# Patient Record
Sex: Female | Born: 1980
Health system: Southern US, Community
[De-identification: ages and names within clinical notes are randomized; demographics above are authoritative.]

## PROBLEM LIST (undated history)

## (undated) DIAGNOSIS — T7840XA Allergy, unspecified, initial encounter: Secondary | ICD-10-CM

## (undated) DIAGNOSIS — Z8619 Personal history of other infectious and parasitic diseases: Secondary | ICD-10-CM

## (undated) HISTORY — DX: Allergy, unspecified, initial encounter: T78.40XA

## (undated) HISTORY — DX: Personal history of other infectious and parasitic diseases: Z86.19

---

## 2019-06-05 ENCOUNTER — Ambulatory Visit: Payer: 59 | Admitting: Physician Assistant

## 2019-06-05 ENCOUNTER — Other Ambulatory Visit: Payer: Self-pay

## 2019-06-05 ENCOUNTER — Encounter: Payer: Self-pay | Admitting: Physician Assistant

## 2019-06-05 VITALS — BP 120/84 | HR 89 | Temp 99.2°F | Resp 16 | Ht 64.75 in | Wt 144.0 lb

## 2019-06-05 DIAGNOSIS — F32A Depression, unspecified: Secondary | ICD-10-CM

## 2019-06-05 DIAGNOSIS — R5383 Other fatigue: Secondary | ICD-10-CM

## 2019-06-05 DIAGNOSIS — Z30011 Encounter for initial prescription of contraceptive pills: Secondary | ICD-10-CM | POA: Diagnosis not present

## 2019-06-05 DIAGNOSIS — F419 Anxiety disorder, unspecified: Secondary | ICD-10-CM

## 2019-06-05 DIAGNOSIS — F329 Major depressive disorder, single episode, unspecified: Secondary | ICD-10-CM

## 2019-06-05 DIAGNOSIS — J310 Chronic rhinitis: Secondary | ICD-10-CM | POA: Diagnosis not present

## 2019-06-05 MED ORDER — NORETHINDRONE ACET-ETHINYL EST 1-20 MG-MCG PO TABS: 1.0000 | ORAL_TABLET | Freq: Every day | ORAL | 11 refills | Status: DC

## 2019-06-05 MED ORDER — AZELASTINE-FLUTICASONE 137-50 MCG/ACT NA SUSP: 2.0000 | Freq: Every day | NASAL | 1 refills | Status: DC

## 2019-06-05 MED ORDER — BUPROPION HCL ER (XL) 150 MG PO TB24: 150.0000 mg | ORAL_TABLET | Freq: Every day | ORAL | 1 refills | Status: DC

## 2019-06-05 NOTE — Patient Instructions (Addendum)
Please go to the lab today for blood work.  I will call you with your results. We will alter treatment regimen(s) if indicated by your results.   Continue the Allegra and start the Dymista spray as directed. Monitor symptoms over the next couple of weeks. Please let me know if symptoms are not improving.   Please continue counseling.  Start the Wellbutrin once daily.  Follow-up with me in 3-4 weeks for reassessment.   I have switched you to Loestrin for birth control. This is more similar to your prior birth control but is a lower dose.  Let [place] see how this works for you.  It was very nice meeting you today. Welcome to [facility]!

## 2019-06-05 NOTE — Progress Notes (Signed)
Patient presents to clinic today to establish care.  Acute Concerns: Over the past 2 months having significant increase in stressors. Notes she and her fiance have moved in together and have started to blend their families. Notes her future step-children have been living with them every other week.This has been a major adjustment,m esp[ecially since they are pre-teen, teenage aged.. Not handling very well -- causing anxiety. Has started counseling to help with this adjustment. Sometimes with depressed mood. Denies SI/HI. Thinking about medication. Also noting change in focus and mental fogginess. This is mainly at work. Has  Notes fatigue Fine at work but when she gets home she crashes. Sleeps well overall. Previously on a regimen of Prozac when she was younger (teenage years). BuSpar a few years ago for anxiety (made her too sleepy). Denies SI/HI.  Chronic Issues: Chronic Rhinitis -- Is currently on Flonase and Allegra which she notes worked for her in [place] but here still having breakthrough symptoms. Denies chest tightness, wheezing or SOB.  Contraception Management -- Is currently on a regimen of Norethindrone. 0.35 mcg daily. Has been on this for > 6-7 months. Notes she is taking as directed. Does not like this medication and preferred her previous combination OCP. No history of clotting.[place] [place] LMP finished 2 weeks ago.   Health Maintenance: Immunizations -- UTD PAP --  Endorses last PCP smear within past couple of years. Will need records..   Past Medical History:  Diagnosis Date  . Allergy   . History of chickenpox     Past Surgical History:  Procedure Laterality Date  . CESAREAN SECTION  2017    Current Outpatient Medications on File Prior to Visit  Medication Sig Dispense Refill  . fexofenadine (ALLEGRA) 180 MG tablet Take 180 mg by mouth daily.    . fluticasone (FLONASE) 50 MCG/ACT nasal spray Place 2 sprays into both nostrils daily.     No current facility-administered  medications on file prior to visit.     No Known Allergies  Family History  Problem Relation Age of Onset  . Kidney failure Mother        secondary to flu-like illness and medication overuse. Not cause of death. Had to have renal transplant.   . Hypertension Mother        2/2 renal issues  . Stroke Father   . Healthy Brother   . Healthy Daughter     Social History   Socioeconomic History  . Marital status: Single    Spouse name: Not on file  . Number of children: 1  . Years of education: Not on file  . Highest education level: Not on file  Occupational History  . Not on file  Social Needs  . Financial resource strain: Not on file  . Food insecurity    Worry: Not on file    Inability: Not on file  . Transportation needs    Medical: Not on file    Non-medical: Not on file  Tobacco Use  . Smoking status: Former Research scientist (life sciences)  . Smokeless tobacco: Never Used  Substance and Sexual Activity  . Alcohol use: Yes  . Drug use: Never  . Sexual activity: Yes  Lifestyle  . Physical activity    Days per week: Not on file    Minutes per session: Not on file  . Stress: Not on file  Relationships  . Social Herbalist on phone: Not on file    Gets together: Not on file  Attends religious service: Not on file    Active member of club or organization: Not on file    Attends meetings of clubs or organizations: Not on file    Relationship status: Not on file  . Intimate partner violence    Fear of current or ex partner: Not on file    Emotionally abused: Not on file    Physically abused: Not on file    Forced sexual activity: Not on file  Other Topics Concern  . Not on file  Social History Narrative  . Not on file   Review of Systems  Constitutional: Positive for malaise/fatigue. Negative for chills, fever and weight loss.  Eyes: Negative for blurred vision and double vision.  Respiratory: Negative for cough and shortness of breath.   Cardiovascular: Negative for  chest pain and palpitations.  Gastrointestinal: Negative for abdominal pain, constipation, diarrhea, nausea and vomiting.  Genitourinary: Negative for dysuria, frequency, hematuria and urgency.  Musculoskeletal: Negative for joint pain and myalgias.  Neurological: Negative for dizziness and loss of consciousness.  Endo/Heme/Allergies: Positive for environmental allergies.  Psychiatric/Behavioral: Positive for depression. Negative for hallucinations, substance abuse and suicidal ideas. The patient is nervous/anxious.    BP 120/84   Pulse 89   Temp 99.2 F (37.3 C) (Skin)   Resp 16   Ht 5' 4.75" (1.645 m)   Wt 144 lb (65.3 kg)   SpO2 99%   BMI 24.15 kg/m   Physical Exam Vitals signs reviewed.  Constitutional:      Appearance: Normal appearance.  HENT:     Head: Normocephalic and atraumatic.     Right Ear: Tympanic membrane normal.     Left Ear: Tympanic membrane normal.     Nose: Nose normal.     Mouth/Throat:     Mouth: Mucous membranes are moist.  Eyes:     Conjunctiva/sclera: Conjunctivae normal.     Pupils: Pupils are equal, round, and reactive to light.  Neck:     Musculoskeletal: Neck supple. No muscular tenderness.     Thyroid: No thyromegaly.  Cardiovascular:     Rate and Rhythm: Normal rate and regular rhythm.     Pulses: Normal pulses.     Heart sounds: Normal heart sounds.  Pulmonary:     Effort: Pulmonary effort is normal.     Breath sounds: Normal breath sounds.  Abdominal:     General: Bowel sounds are normal. There is no distension.     Palpations: Abdomen is soft.     Tenderness: There is no abdominal tenderness.  Lymphadenopathy:     Cervical: No cervical adenopathy.  Neurological:     General: No focal deficit present.     Mental Status: She is alert and oriented to person, place, and time.  Psychiatric:        Mood and Affect: Mood normal.    Assessment/Plan: 1. Anxiety and depression Giving some focus issue along with mood changes, will  attempt trial of wellbutrin 150 mg once daily. Continue counseling. Follow-up in 4-6 weeks for reassessment. - buPROPion (WELLBUTRIN XL) 150 MG 24 hr tablet; Take 1 tablet (150 mg total) by mouth daily.  Dispense: 30 tablet; Refill: 1  2. Fatigue, unspecified type Likely related to her mood which we are treating. Exam unremarkable today. Will check labs to further assess as well. - Comp Met (CMET) - TSH - CBC w/Diff - Vitamin D (25 hydroxy)  3. Encounter for initial prescription of contraceptive pills Currently on OCP. LMP finished 2  weeks ago. Will switch her back to a combination OCP but at lower dose due to age. Follow-up discussed.  4. Chronic rhinitis Will have her continue Allegra. Switch Flonase to Dymista. If not improving may need to consider trial or singulair or allergist assessment.  - Azelastine-Fluticasone 137-50 MCG/ACT SUSP; Place 2 sprays into the nose daily.  Dispense: 23 g; Refill: Potter, PA-C

## 2019-06-06 LAB — CBC WITH DIFFERENTIAL/PLATELET
Basophils Absolute: 0 10*3/uL (ref 0.0–0.1)
Basophils Relative: 0.4 % (ref 0.0–3.0)
Eosinophils Absolute: 0 10*3/uL (ref 0.0–0.7)
Eosinophils Relative: 0.3 % (ref 0.0–5.0)
HCT: 44.6 % (ref 36.0–46.0)
Hemoglobin: 15 g/dL (ref 12.0–15.0)
Lymphocytes Relative: 20.2 % (ref 12.0–46.0)
Lymphs Abs: 1.7 10*3/uL (ref 0.7–4.0)
MCHC: 33.7 g/dL (ref 30.0–36.0)
MCV: 91.3 fl (ref 78.0–100.0)
Monocytes Absolute: 0.3 10*3/uL (ref 0.1–1.0)
Monocytes Relative: 4.2 % (ref 3.0–12.0)
Neutro Abs: 6.2 10*3/uL (ref 1.4–7.7)
Neutrophils Relative %: 74.9 % (ref 43.0–77.0)
Platelets: 299 10*3/uL (ref 150.0–400.0)
RBC: 4.88 Mil/uL (ref 3.87–5.11)
RDW: 12.7 % (ref 11.5–15.5)
WBC: 8.3 10*3/uL (ref 4.0–10.5)

## 2019-06-06 LAB — VITAMIN D 25 HYDROXY (VIT D DEFICIENCY, FRACTURES): VITD: 49.35 ng/mL (ref 30.00–100.00)

## 2019-06-06 LAB — COMPREHENSIVE METABOLIC PANEL
ALT: 33 U/L (ref 0–35)
AST: 25 U/L (ref 0–37)
Albumin: 4.8 g/dL (ref 3.5–5.2)
Alkaline Phosphatase: 34 U/L — ABNORMAL LOW (ref 39–117)
BUN: 10 mg/dL (ref 6–23)
CO2: 25 mEq/L (ref 19–32)
Calcium: 9.5 mg/dL (ref 8.4–10.5)
Chloride: 102 mEq/L (ref 96–112)
Creatinine, Ser: 0.54 mg/dL (ref 0.40–1.20)
GFR: 125.48 mL/min (ref >60.00)
Glucose, Bld: 89 mg/dL (ref 70–99)
Potassium: 3.8 mEq/L (ref 3.5–5.1)
Sodium: 138 mEq/L (ref 135–145)
Total Bilirubin: 0.6 mg/dL (ref 0.2–1.2)
Total Protein: 7.1 g/dL (ref 6.0–8.3)

## 2019-06-06 LAB — TSH: TSH: 1.03 u[IU]/mL (ref 0.35–4.50)

## 2019-06-08 ENCOUNTER — Telehealth: Payer: Self-pay | Admitting: Emergency Medicine

## 2019-06-08 DIAGNOSIS — J310 Chronic rhinitis: Secondary | ICD-10-CM

## 2019-06-08 MED ORDER — AZELASTINE HCL 0.1 % NA SOLN: 1.0000 | Freq: Two times a day (BID) | NASAL | 6 refills | Status: DC

## 2019-06-08 NOTE — Telephone Encounter (Signed)
Rx sent to the pharmacy. D/c the Dymista nasal spray.

## 2019-06-08 NOTE — Telephone Encounter (Signed)
Spoke with patient and she advised that the Dymista nasal spray was too expensive. She has continued to use the [facility] for now. Please advise of cheaper medication.

## 2019-06-08 NOTE — Addendum Note (Signed)
Addended by: Leonidas Romberg on: 06/08/2019 11:20 AM   Modules accepted: Orders

## 2019-06-08 NOTE — Telephone Encounter (Signed)
Let's try to send in the Astelin nasal spray by itself -- 1 spray each nostril BID. She is to use this with the Flonase spray.

## 2019-06-21 ENCOUNTER — Telehealth: Payer: 59 | Admitting: Family

## 2019-06-21 DIAGNOSIS — R399 Unspecified symptoms and signs involving the genitourinary system: Secondary | ICD-10-CM | POA: Diagnosis not present

## 2019-06-21 MED ORDER — CEPHALEXIN 500 MG PO CAPS: 500.0000 mg | ORAL_CAPSULE | Freq: Two times a day (BID) | ORAL | 0 refills | Status: DC

## 2019-06-21 NOTE — Progress Notes (Signed)

## 2019-06-29 ENCOUNTER — Other Ambulatory Visit: Payer: Self-pay

## 2019-06-29 ENCOUNTER — Encounter: Payer: Self-pay | Admitting: Physician Assistant

## 2019-06-29 ENCOUNTER — Ambulatory Visit (INDEPENDENT_AMBULATORY_CARE_PROVIDER_SITE_OTHER): Payer: 59 | Admitting: Physician Assistant

## 2019-06-29 VITALS — BP 122/80 | HR 95 | Temp 98.6°F | Resp 14 | Ht 64.75 in | Wt 145.0 lb

## 2019-06-29 DIAGNOSIS — R252 Cramp and spasm: Secondary | ICD-10-CM

## 2019-06-29 DIAGNOSIS — F32A Depression, unspecified: Secondary | ICD-10-CM

## 2019-06-29 DIAGNOSIS — F329 Major depressive disorder, single episode, unspecified: Secondary | ICD-10-CM | POA: Diagnosis not present

## 2019-06-29 DIAGNOSIS — F419 Anxiety disorder, unspecified: Secondary | ICD-10-CM | POA: Diagnosis not present

## 2019-06-29 LAB — MAGNESIUM: Magnesium: 2.1 mg/dL (ref 1.5–2.5)

## 2019-06-29 MED ORDER — BUPROPION HCL ER (XL) 300 MG PO TB24: 300.0000 mg | ORAL_TABLET | Freq: Every day | ORAL | 2 refills | Status: DC

## 2019-06-29 NOTE — Progress Notes (Signed)
Patient presents to clinic today for follow-up of focus, anxiety and depression. Was started on a regimen of Wellbutrin XL 150 mg daily at last visit. Endorses taking as directed and tolerating well but has not noted significant change in symptoms. Notes she is getting more adjusted to her new schedule which does help. Would like to discuss medication dose.  Patient also notes infrequent but bothersome cramping of the hands. Can happen anytime. No symptoms happening while sleep at night. Keeps hydrated and tries to keep well-balanced diet. Denies pain or swelling of joints. Denies decreased ROM, numbness or tingling. Denies cramping of legs or feet.   Past Medical History:  Diagnosis Date  . Allergy   . History of chickenpox     Current Outpatient Medications on File Prior to Visit  Medication Sig Dispense Refill  . azelastine (ASTELIN) 0.1 % nasal spray Place 1 spray into both nostrils 2 (two) times daily. Along with Flonase nasal spray 30 mL 6  . buPROPion (WELLBUTRIN XL) 150 MG 24 hr tablet Take 1 tablet (150 mg total) by mouth daily. 30 tablet 1  . fexofenadine (ALLEGRA) 180 MG tablet Take 180 mg by mouth daily.    . fluticasone (FLONASE) 50 MCG/ACT nasal spray Place 2 sprays into both nostrils daily.    . norethindrone-ethinyl estradiol (LOESTRIN 1/20, 21,) 1-20 MG-MCG tablet Take 1 tablet by mouth daily. 1 Package 11   No current facility-administered medications on file prior to visit.     No Known Allergies  Family History  Problem Relation Age of Onset  . Kidney failure Mother        secondary to flu-like illness and medication overuse. Not cause of death. Had to have renal transplant.   . Hypertension Mother        2/2 renal issues  . Stroke Father   . Healthy Brother   . Healthy Daughter     Social History   Socioeconomic History  . Marital status: Single    Spouse name: Not on file  . Number of children: 1  . Years of education: Not on file  . Highest  education level: Not on file  Occupational History  . Not on file  Social Needs  . Financial resource strain: Not on file  . Food insecurity    Worry: Not on file    Inability: Not on file  . Transportation needs    Medical: Not on file    Non-medical: Not on file  Tobacco Use  . Smoking status: Former Research scientist (life sciences)  . Smokeless tobacco: Never Used  Substance and Sexual Activity  . Alcohol use: Yes  . Drug use: Never  . Sexual activity: Yes  Lifestyle  . Physical activity    Days per week: Not on file    Minutes per session: Not on file  . Stress: Not on file  Relationships  . Social Herbalist on phone: Not on file    Gets together: Not on file    Attends religious service: Not on file    Active member of club or organization: Not on file    Attends meetings of clubs or organizations: Not on file    Relationship status: Not on file  Other Topics Concern  . Not on file  Social History Narrative  . Not on file   Review of Systems - See HPI.  All other ROS are negative.  BP 122/80   Pulse 95   Temp 98.6 F (37  C) (Skin)   Resp 14   Ht 5' 4.75" (1.645 m)   Wt 145 lb (65.8 kg)   SpO2 98%   BMI 24.32 kg/m   Physical Exam Vitals signs reviewed.  Constitutional:      Appearance: Normal appearance.  HENT:     Head: Normocephalic and atraumatic.     Mouth/Throat:     Mouth: Mucous membranes are moist.  Neck:     Musculoskeletal: Neck supple.  Cardiovascular:     Rate and Rhythm: Normal rate and regular rhythm.     Heart sounds: Normal heart sounds.  Pulmonary:     Effort: Pulmonary effort is normal.  Neurological:     General: No focal deficit present.     Mental Status: She is alert and oriented to person, place, and time.  Psychiatric:        Mood and Affect: Mood normal.    Recent Results (from the past 2160 hour(s))  Comp Met (CMET)     Status: Abnormal   Collection Time: 06/05/19  2:47 PM  Result Value Ref Range   Sodium 138 135 - 145 mEq/L    Potassium 3.8 3.5 - 5.1 mEq/L   Chloride 102 96 - 112 mEq/L   CO2 25 19 - 32 mEq/L   Glucose, Bld 89 70 - 99 mg/dL   BUN 10 6 - 23 mg/dL   Creatinine, Ser 0.54 0.40 - 1.20 mg/dL   Total Bilirubin 0.6 0.2 - 1.2 mg/dL   Alkaline Phosphatase 34 (L) 39 - 117 U/L   AST 25 0 - 37 U/L   ALT 33 0 - 35 U/L   Total Protein 7.1 6.0 - 8.3 g/dL   Albumin 4.8 3.5 - 5.2 g/dL   Calcium 9.5 8.4 - 10.5 mg/dL   GFR 125.48 >60.00 mL/min  TSH     Status: None   Collection Time: 06/05/19  2:47 PM  Result Value Ref Range   TSH 1.03 0.35 - 4.50 uIU/mL  CBC w/Diff     Status: None   Collection Time: 06/05/19  2:47 PM  Result Value Ref Range   WBC 8.3 4.0 - 10.5 K/uL   RBC 4.88 3.87 - 5.11 Mil/uL   Hemoglobin 15.0 12.0 - 15.0 g/dL   HCT 44.6 36.0 - 46.0 %   MCV 91.3 78.0 - 100.0 fl   MCHC 33.7 30.0 - 36.0 g/dL   RDW 12.7 11.5 - 15.5 %   Platelets 299.0 150.0 - 400.0 K/uL   Neutrophils Relative % 74.9 43.0 - 77.0 %   Lymphocytes Relative 20.2 12.0 - 46.0 %   Monocytes Relative 4.2 3.0 - 12.0 %   Eosinophils Relative 0.3 0.0 - 5.0 %   Basophils Relative 0.4 0.0 - 3.0 %   Neutro Abs 6.2 1.4 - 7.7 K/uL   Lymphs Abs 1.7 0.7 - 4.0 K/uL   Monocytes Absolute 0.3 0.1 - 1.0 K/uL   Eosinophils Absolute 0.0 0.0 - 0.7 K/uL   Basophils Absolute 0.0 0.0 - 0.1 K/uL  Vitamin D (25 hydroxy)     Status: None   Collection Time: 06/05/19  2:47 PM  Result Value Ref Range   VITD 49.35 30.00 - 100.00 ng/mL   Assessment/Plan: 1. Anxiety and depression Still with inattention. Increase Wellbutrin to 300 mg daily. Follow-up 1 month via video visit for reassessment. - buPROPion (WELLBUTRIN XL) 300 MG 24 hr tablet; Take 1 tablet (300 mg total) by mouth daily.  Dispense: 30 tablet; Refill: 2  2.  Cramping of hands Intermittent and mild. Endorses keeping hydrated and well balanced diet. Recent CMP shows stable electrolytes. Will check magnesium level today. Supportive measures reviewed.  - Magnesium   Leeanne Rio, PA-C

## 2019-06-29 NOTE — Patient Instructions (Signed)
Please go to the lab today for blood work.  I will call you with your results. We will alter treatment regimen(s) if indicated by your results.   Please start the new dose of Wellbutrin XL once daily, taking as directed. Follow-up with [place] in 3-4 weeks via video visit. The staff up front will help to schedule this.  Hang in there!

## 2019-06-30 ENCOUNTER — Encounter: Payer: Self-pay | Admitting: Physician Assistant

## 2019-06-30 DIAGNOSIS — R41840 Attention and concentration deficit: Secondary | ICD-10-CM | POA: Insufficient documentation

## 2019-06-30 DIAGNOSIS — F1121 Opioid dependence, in remission: Secondary | ICD-10-CM | POA: Insufficient documentation

## 2019-06-30 DIAGNOSIS — F4320 Adjustment disorder, unspecified: Secondary | ICD-10-CM | POA: Insufficient documentation

## 2019-07-04 ENCOUNTER — Encounter: Payer: Self-pay | Admitting: Physician Assistant

## 2019-07-06 MED ORDER — BUPROPION HCL ER (XL) 150 MG PO TB24: 150.0000 mg | ORAL_TABLET | Freq: Every day | ORAL | 1 refills | Status: DC

## 2019-07-21 ENCOUNTER — Encounter: Payer: Self-pay | Admitting: Physician Assistant

## 2019-07-21 ENCOUNTER — Ambulatory Visit (INDEPENDENT_AMBULATORY_CARE_PROVIDER_SITE_OTHER): Payer: 59 | Admitting: Physician Assistant

## 2019-07-21 ENCOUNTER — Other Ambulatory Visit (HOSPITAL_COMMUNITY)
Admission: RE | Admit: 2019-07-21 | Discharge: 2019-07-21 | Disposition: A | Discharge status: Home / Self Care | Payer: 59 | Source: Ambulatory Visit | Attending: Cardiology | Admitting: Cardiology

## 2019-07-21 ENCOUNTER — Other Ambulatory Visit: Payer: Self-pay

## 2019-07-21 VITALS — BP 126/88 | HR 76 | Temp 98.5°F | Resp 16 | Ht 64.75 in | Wt 144.0 lb

## 2019-07-21 DIAGNOSIS — R3915 Urgency of urination: Secondary | ICD-10-CM

## 2019-07-21 DIAGNOSIS — Z8619 Personal history of other infectious and parasitic diseases: Secondary | ICD-10-CM

## 2019-07-21 DIAGNOSIS — Z124 Encounter for screening for malignant neoplasm of cervix: Secondary | ICD-10-CM | POA: Insufficient documentation

## 2019-07-21 DIAGNOSIS — Z0000 Encounter for general adult medical examination without abnormal findings: Secondary | ICD-10-CM

## 2019-07-21 DIAGNOSIS — Z0001 Encounter for general adult medical examination with abnormal findings: Secondary | ICD-10-CM

## 2019-07-21 LAB — LIPID PANEL
Cholesterol: 147 mg/dL (ref 0–200)
HDL: 60.4 mg/dL (ref >39.00)
LDL Cholesterol: 67 mg/dL (ref 0–99)
NonHDL: 86.56
Total CHOL/HDL Ratio: 2
Triglycerides: 100 mg/dL (ref 0.0–149.0)
VLDL: 20 mg/dL (ref 0.0–40.0)

## 2019-07-21 LAB — POCT URINALYSIS DIPSTICK
Bilirubin, UA: NEGATIVE
Glucose, UA: NEGATIVE
Leukocytes, UA: NEGATIVE
Nitrite, UA: NEGATIVE
Protein, UA: NEGATIVE
Spec Grav, UA: 1.02 (ref 1.010–1.025)
Urobilinogen, UA: 0.2 E.U./dL
pH, UA: 6 (ref 5.0–8.0)

## 2019-07-21 MED ORDER — VALACYCLOVIR HCL 1 G PO TABS: 2000.0000 mg | ORAL_TABLET | Freq: Two times a day (BID) | ORAL | 0 refills | Status: DC

## 2019-07-21 NOTE — Patient Instructions (Signed)
Please go to the lab for blood work.   Our office will call you with your results unless you have chosen to receive results via MyChart.  If your blood work is normal we will follow-up each year for physicals and as scheduled for chronic medical problems.  If anything is abnormal we will treat accordingly and get you in for a follow-up.  Consider starting an OTC L-lysine to help with cold sores. I have sent in a prescription for Valtrex to have on hand in case of outbreak not controlled with Abreva   Preventive Care 39-84 Years Old, Female Preventive care refers to visits with your health care provider and lifestyle choices that can promote health and wellness. This includes:  A yearly physical exam. This may also be called an annual well check.  Regular dental visits and eye exams.  Immunizations.  Screening for certain conditions.  Healthy lifestyle choices, such as eating a healthy diet, getting regular exercise, not using drugs or products that contain nicotine and tobacco, and limiting alcohol use. What can I expect for my preventive care visit? Physical exam Your health care provider will check your:  Height and weight. This may be used to calculate body mass index (BMI), which tells if you are at a healthy weight.  Heart rate and blood pressure.  Skin for abnormal spots. Counseling Your health care provider may ask you questions about your:  Alcohol, tobacco, and drug use.  Emotional well-being.  Home and relationship well-being.  Sexual activity.  Eating habits.  Work and work Statistician.  Method of birth control.  Menstrual cycle.  Pregnancy history. What immunizations do I need?  Influenza (flu) vaccine  This is recommended every year. Tetanus, diphtheria, and pertussis (Tdap) vaccine  You may need a Td booster every 10 years. Varicella (chickenpox) vaccine  You may need this if you have not been vaccinated. Human papillomavirus (HPV) vaccine   If recommended by your health care provider, you may need three doses over 6 months. Measles, mumps, and rubella (MMR) vaccine  You may need at least one dose of MMR. You may also need a second dose. Meningococcal conjugate (MenACWY) vaccine  One dose is recommended if you are age 39-21 years and a first-year college student living in a residence hall, or if you have one of several medical conditions. You may also need additional booster doses. Pneumococcal conjugate (PCV13) vaccine  You may need this if you have certain conditions and were not previously vaccinated. Pneumococcal polysaccharide (PPSV23) vaccine  You may need one or two doses if you smoke cigarettes or if you have certain conditions. Hepatitis A vaccine  You may need this if you have certain conditions or if you travel or work in places where you may be exposed to hepatitis A. Hepatitis B vaccine  You may need this if you have certain conditions or if you travel or work in places where you may be exposed to hepatitis B. Haemophilus influenzae type b (Hib) vaccine  You may need this if you have certain conditions. You may receive vaccines as individual doses or as more than one vaccine together in one shot (combination vaccines). Talk with your health care provider about the risks and benefits of combination vaccines. What tests do I need?  Blood tests  Lipid and cholesterol levels. These may be checked every 5 years starting at age 39.  Hepatitis C test.  Hepatitis B test. Screening  Diabetes screening. This is done by checking your blood sugar (  glucose) after you have not eaten for a while (fasting).  Sexually transmitted disease (STD) testing.  BRCA-related cancer screening. This may be done if you have a family history of breast, ovarian, tubal, or peritoneal cancers.  Pelvic exam and Pap test. This may be done every 3 years starting at age 39 Starting at age 31, this may be done every 5 years if you  have a Pap test in combination with an HPV test. Talk with your health care provider about your test results, treatment options, and if necessary, the need for more tests. Follow these instructions at home: Eating and drinking   Eat a diet that includes fresh fruits and vegetables, whole grains, lean protein, and low-fat dairy.  Take vitamin and mineral supplements as recommended by your health care provider.  Do not drink alcohol if: ? Your health care provider tells you not to drink. ? You are pregnant, may be pregnant, or are planning to become pregnant.  If you drink alcohol: ? Limit how much you have to 0-1 drink a day. ? Be aware of how much alcohol is in your drink. In the [place] one drink equals one 12 oz bottle of beer (355 mL), one 5 oz glass of wine (148 mL), or one 1 oz glass of hard liquor (44 mL). Lifestyle  Take daily care of your teeth and gums.  Stay active. Exercise for at least 30 minutes on 5 or more days each week.  Do not use any products that contain nicotine or tobacco, such as cigarettes, e-cigarettes, and chewing tobacco. If you need help quitting, ask your health care provider.  If you are sexually active, practice safe sex. Use a condom or other form of birth control (contraception) in order to prevent pregnancy and STIs (sexually transmitted infections). If you plan to become pregnant, see your health care provider for a preconception visit. What's next?  Visit your health care provider once a year for a well check visit.  Ask your health care provider how often you should have your eyes and teeth checked.  Stay up to date on all vaccines. This information is not intended to replace advice given to you by your health care provider. Make sure you discuss any questions you have with your health care provider. Document Released: 12/29/2001 Document Revised: 07/14/2018 Document Reviewed: 07/14/2018 Elsevier Patient Education  2020 [employer]. .

## 2019-07-21 NOTE — Progress Notes (Signed)
Patient presents to clinic today for annual exam.  Patient is fasting for labs.  Acute Concerns: Patient endorses history of cold sores, infrequent, maybe 2 per year. Notes if she applies Abreva at first sign of it developing (tingling or itching in the area) she can keep symptoms handled. Notes occasionally will have full blow cold sore. Wanting to know if there is something she can take when this happens and anything she can take OTC to help with prevention.   Patient also notes some stronger odor to urine without dysuria, urinary urgency or frequency.    Health Maintenance: Immunizations -- Declines flu shot.  PAP -- Due. Agrees to have today.  Past Medical History:  Diagnosis Date  . Allergy   . History of chickenpox     Past Surgical History:  Procedure Laterality Date  . CESAREAN SECTION  2017    Current Outpatient Medications on File Prior to Visit  Medication Sig Dispense Refill  . azelastine (ASTELIN) 0.1 % nasal spray Place 1 spray into both nostrils 2 (two) times daily. Along with Flonase nasal spray 30 mL 6  . buPROPion (WELLBUTRIN XL) 150 MG 24 hr tablet Take 1 tablet (150 mg total) by mouth daily. 30 tablet 1  . fexofenadine (ALLEGRA) 180 MG tablet Take 180 mg by mouth daily.    . fluticasone (FLONASE) 50 MCG/ACT nasal spray Place 2 sprays into both nostrils daily.    . norethindrone-ethinyl estradiol (LOESTRIN 1/20, 21,) 1-20 MG-MCG tablet Take 1 tablet by mouth daily. 1 Package 11   No current facility-administered medications on file prior to visit.     No Known Allergies  Family History  Problem Relation Age of Onset  . Kidney failure Mother        secondary to flu-like illness and medication overuse. Not cause of death. Had to have renal transplant.   . Hypertension Mother        2/2 renal issues  . Stroke Father   . Healthy Brother   . Healthy Daughter     Social History   Socioeconomic History  . Marital status: Single    Spouse name: Not  on file  . Number of children: 1  . Years of education: Not on file  . Highest education level: Not on file  Occupational History  . Not on file  Social Needs  . Financial resource strain: Not on file  . Food insecurity    Worry: Not on file    Inability: Not on file  . Transportation needs    Medical: Not on file    Non-medical: Not on file  Tobacco Use  . Smoking status: Former Research scientist (life sciences)  . Smokeless tobacco: Never Used  Substance and Sexual Activity  . Alcohol use: Yes  . Drug use: Never  . Sexual activity: Yes  Lifestyle  . Physical activity    Days per week: Not on file    Minutes per session: Not on file  . Stress: Not on file  Relationships  . Social Herbalist on phone: Not on file    Gets together: Not on file    Attends religious service: Not on file    Active member of club or organization: Not on file    Attends meetings of clubs or organizations: Not on file    Relationship status: Not on file  . Intimate partner violence    Fear of current or ex partner: Not on file    Emotionally  abused: Not on file    Physically abused: Not on file    Forced sexual activity: Not on file  Other Topics Concern  . Not on file  Social History Narrative  . Not on file   Review of Systems  Constitutional: Negative for fever and weight loss.  HENT: Negative for ear discharge, ear pain, hearing loss and tinnitus.   Eyes: Negative for blurred vision, double vision, photophobia and pain.  Respiratory: Negative for cough and shortness of breath.   Cardiovascular: Negative for chest pain and palpitations.  Gastrointestinal: Negative for abdominal pain, blood in stool, constipation, diarrhea, heartburn, melena, nausea and vomiting.  Genitourinary: Negative for dysuria, flank pain, frequency, hematuria and urgency.  Musculoskeletal: Negative for falls.  Neurological: Negative for dizziness, loss of consciousness and headaches.  Endo/Heme/Allergies: Negative for  environmental allergies.  Psychiatric/Behavioral: Negative for depression, hallucinations, substance abuse and suicidal ideas. The patient is not nervous/anxious and does not have insomnia.    BP 126/88   Pulse 76   Temp 98.5 F (36.9 C) (Skin)   Resp 16   Ht 5' 4.75" (1.645 m)   Wt 144 lb (65.3 kg)   SpO2 98%   BMI 24.15 kg/m   Physical Exam Vitals signs reviewed.  HENT:     Head: Normocephalic and atraumatic.     Right Ear: Tympanic membrane, ear canal and external ear normal.     Left Ear: Tympanic membrane, ear canal and external ear normal.     Nose: Nose normal. No mucosal edema.     Mouth/Throat:     Pharynx: Uvula midline. No oropharyngeal exudate or posterior oropharyngeal erythema.  Eyes:     Conjunctiva/sclera: Conjunctivae normal.     Pupils: Pupils are equal, round, and reactive to light.  Neck:     Musculoskeletal: Neck supple.     Thyroid: No thyromegaly.  Cardiovascular:     Rate and Rhythm: Normal rate and regular rhythm.     Heart sounds: Normal heart sounds.  Pulmonary:     Effort: Pulmonary effort is normal. No respiratory distress.     Breath sounds: Normal breath sounds. No wheezing or rales.  Abdominal:     General: Bowel sounds are normal. There is no distension.     Palpations: Abdomen is soft. There is no mass.     Tenderness: There is no abdominal tenderness. There is no guarding or rebound.  Lymphadenopathy:     Cervical: No cervical adenopathy.  Skin:    General: Skin is warm and dry.     Findings: No rash.  Neurological:     Mental Status: She is alert and oriented to person, place, and time.     Cranial Nerves: No cranial nerve deficit.    Recent Results (from the past 2160 hour(s))  Comp Met (CMET)     Status: Abnormal   Collection Time: 06/05/19  2:47 PM  Result Value Ref Range   Sodium 138 135 - 145 mEq/L   Potassium 3.8 3.5 - 5.1 mEq/L   Chloride 102 96 - 112 mEq/L   CO2 25 19 - 32 mEq/L   Glucose, Bld 89 70 - 99 mg/dL   BUN  10 6 - 23 mg/dL   Creatinine, Ser 0.54 0.40 - 1.20 mg/dL   Total Bilirubin 0.6 0.2 - 1.2 mg/dL   Alkaline Phosphatase 34 (L) 39 - 117 U/L   AST 25 0 - 37 U/L   ALT 33 0 - 35 U/L   Total Protein  7.1 6.0 - 8.3 g/dL   Albumin 4.8 3.5 - 5.2 g/dL   Calcium 9.5 8.4 - 10.5 mg/dL   GFR 125.48 >60.00 mL/min  TSH     Status: None   Collection Time: 06/05/19  2:47 PM  Result Value Ref Range   TSH 1.03 0.35 - 4.50 uIU/mL  CBC w/Diff     Status: None   Collection Time: 06/05/19  2:47 PM  Result Value Ref Range   WBC 8.3 4.0 - 10.5 K/uL   RBC 4.88 3.87 - 5.11 Mil/uL   Hemoglobin 15.0 12.0 - 15.0 g/dL   HCT 44.6 36.0 - 46.0 %   MCV 91.3 78.0 - 100.0 fl   MCHC 33.7 30.0 - 36.0 g/dL   RDW 12.7 11.5 - 15.5 %   Platelets 299.0 150.0 - 400.0 K/uL   Neutrophils Relative % 74.9 43.0 - 77.0 %   Lymphocytes Relative 20.2 12.0 - 46.0 %   Monocytes Relative 4.2 3.0 - 12.0 %   Eosinophils Relative 0.3 0.0 - 5.0 %   Basophils Relative 0.4 0.0 - 3.0 %   Neutro Abs 6.2 1.4 - 7.7 K/uL   Lymphs Abs 1.7 0.7 - 4.0 K/uL   Monocytes Absolute 0.3 0.1 - 1.0 K/uL   Eosinophils Absolute 0.0 0.0 - 0.7 K/uL   Basophils Absolute 0.0 0.0 - 0.1 K/uL  Vitamin D (25 hydroxy)     Status: None   Collection Time: 06/05/19  2:47 PM  Result Value Ref Range   VITD 49.35 30.00 - 100.00 ng/mL  Magnesium     Status: None   Collection Time: 06/29/19  8:31 AM  Result Value Ref Range   Magnesium 2.1 1.5 - 2.5 mg/dL  Lipid Profile     Status: None   Collection Time: 07/21/19  9:11 AM  Result Value Ref Range   Cholesterol 147 0 - 200 mg/dL    Comment: ATP III Classification       Desirable:  < 200 mg/dL               Borderline High:  200 - 239 mg/dL          High:  > = 240 mg/dL   Triglycerides 100.0 0.0 - 149.0 mg/dL    Comment: Normal:  <150 mg/dLBorderline High:  150 - 199 mg/dL   HDL 60.40 >39.00 mg/dL   VLDL 20.0 0.0 - 40.0 mg/dL   LDL Cholesterol 67 0 - 99 mg/dL   Total CHOL/HDL Ratio 2     Comment:                 Men          Women1/2 Average Risk     3.4          3.3Average Risk          5.0          4.42X Average Risk          9.6          7.13X Average Risk          15.0          11.0                       NonHDL 86.56     Comment: NOTE:  Non-HDL goal should be 30 mg/dL higher than patient's LDL goal (i.e. LDL goal of < 70 mg/dL, would have non-HDL goal of < 100 mg/dL)  POCT urinalysis dipstick     Status: Abnormal   Collection Time: 07/21/19  9:30 AM  Result Value Ref Range   Color, UA yellow    Clarity, UA cloudy    Glucose, UA Negative Negative   Bilirubin, UA negative    Ketones, UA trace    Spec Grav, UA 1.020 1.010 - 1.025   Blood, UA trace    pH, UA 6.0 5.0 - 8.0   Protein, UA Negative Negative   Urobilinogen, UA 0.2 0.2 or 1.0 E.U./dL   Nitrite, UA negative    Leukocytes, UA Negative Negative   Appearance     Odor      Assessment/Plan: 1. Visit for preventive health examination Depression screen negative. Health Maintenance reviewed . Preventive schedule discussed and handout given in AVS. Will obtain fasting labs today (Lipid only as other labs are UTD) - Lipid Profile  2. Cervical cancer screening PAP sent today along with wet prep. Giving age > 30 HPV co-testing with any interpretation ordered.  - Cytology - PAP( [facility])  3. Urinary urgency Urine dip unremarkable except for trace blood -- about to start period. No sign of infection. Wet prep sent. She has been encouraged to increase hydration. - POCT urinalysis dipstick - Cervicovaginal ancillary only( [facility])  4. History of cold sores Reviewed triggers -- stress, heat, trauma, etc. Recommend avoidance when possible. Can consider L-Lysine supplement. Sent in Rx Valtrex to have on file in case of flare.   Leeanne Rio, PA-C

## 2019-07-26 LAB — CYTOLOGY - PAP
HPV 16/18/45 genotyping: NEGATIVE
HPV: DETECTED — AB

## 2019-07-27 ENCOUNTER — Ambulatory Visit: Payer: 59 | Admitting: Physician Assistant

## 2019-07-27 ENCOUNTER — Other Ambulatory Visit: Payer: Self-pay | Admitting: Emergency Medicine

## 2019-07-27 DIAGNOSIS — B977 Papillomavirus as the cause of diseases classified elsewhere: Secondary | ICD-10-CM

## 2019-07-27 DIAGNOSIS — R87612 Low grade squamous intraepithelial lesion on cytologic smear of cervix (LGSIL): Secondary | ICD-10-CM

## 2019-07-27 LAB — CERVICOVAGINAL ANCILLARY ONLY
Bacterial vaginitis: NEGATIVE
Candida vaginitis: NEGATIVE

## 2019-08-02 ENCOUNTER — Other Ambulatory Visit: Payer: Self-pay

## 2019-08-03 ENCOUNTER — Encounter: Payer: Self-pay | Admitting: Gynecology

## 2019-08-03 ENCOUNTER — Ambulatory Visit: Payer: 59 | Admitting: Gynecology

## 2019-08-03 VITALS — BP 118/76 | Ht 65.0 in | Wt 147.0 lb

## 2019-08-03 DIAGNOSIS — R87810 Cervical high risk human papillomavirus (HPV) DNA test positive: Secondary | ICD-10-CM

## 2019-08-03 DIAGNOSIS — N870 Mild cervical dysplasia: Secondary | ICD-10-CM | POA: Diagnosis not present

## 2019-08-03 DIAGNOSIS — R87612 Low grade squamous intraepithelial lesion on cytologic smear of cervix (LGSIL): Secondary | ICD-10-CM

## 2019-08-03 DIAGNOSIS — N750 Cyst of Bartholin's gland: Secondary | ICD-10-CM

## 2019-08-03 NOTE — Progress Notes (Signed)
    Keishla Oyer 19-Dec-1980 [id number]        38 y.o.  G2P0011 new patient presents in referral having recently underwent an annual exam where her Pap smear showed LGSIL with positive high-risk HPV screen negative subtype 16, 18/45.  The patient relates a history of an abnormal Pap smear with HPV 7 or 8 years ago which she never followed up on for further evaluation.  Also notes a swelling on the left outer part of her vagina for about a month following intercourse.  Not painful.  No lesions or other abnormalities noted at the time.  Currently on Loestrin 1/20 equivalent.  Past medical history,surgical history, problem list, medications, allergies, family history and social history were all reviewed and documented in the EPIC chart.  Directed ROS with pertinent positives and negatives documented in the history of present illness/assessment and plan.  Exam: Caryn Bee assistant Vitals:   08/03/19 0903  BP: 118/76  Weight: 147 lb (66.7 kg)  Height: 5\' 5"  (1.651 m)   General appearance:  Normal Abdomen soft nontender without mass guarding rebound Pelvic external BUS vagina with 3 to 4 cm left Bartholin cyst soft nontender.  Cervix grossly normal.  Uterus grossly normal midline mobile nontender.  Adnexa without masses or tenderness.  Colposcopy performed after acetic acid cleanse was adequate noting transformation zone visualized 360 degrees.  Area of hypervascularization 12:00 transformation zone biopsied.  ECC performed.  Patient tolerated well.  Physical Exam  Genitourinary:       Assessment/Plan:  38 y.o. G2P0011 with:  1. Left Bartholin cyst.  Nontender.  Relatively acute onset after intercourse 1 month ago.  Recommended patient observe for now and follow-up in a month if it persists.  We discussed options to include observation, office visit drainage, marsupialization.  The patient will follow-up if she has any persistent palpable abnormalities. 2. LGSIL with positive high-risk HPV  negative subtype 16, 18/45.  We discussed the whole issue of dysplasia, low-grade/high-grade, progression/regression and the positive HPV.  She has a history of an abnormal Pap smear with HPV 7 years ago and did not follow-up on that.  Area of hypervascularization at 12:00 transformation zone biopsied.  ECC performed.  Patient will follow-up for these results.  If normal/low-grade changes then plan expectant management with follow-up Pap smear/HPV in 1 year.  If otherwise then will triage based upon results. 3. Occasional skips in menses.  On low-dose oral contraceptives with active pills x3 weeks and off 1 week.  Missed her last menses but has done this in the past.  Has negative home pregnancy test.  We will follow-up if continues to be an issue.    Anastasio Auerbach MD, 9:26 AM 08/03/2019

## 2019-08-03 NOTE — Patient Instructions (Addendum)
Office will follow-up with the biopsy results.  Follow-up if the Bartholin cyst persists.  Follow-up if menstrual irregularity persists despite being on oral contraceptives.

## 2019-08-08 ENCOUNTER — Encounter: Payer: Self-pay | Admitting: Gynecology

## 2019-08-08 LAB — PATHOLOGY REPORT

## 2019-08-08 LAB — TISSUE PATH REPORT

## 2019-10-02 ENCOUNTER — Telehealth: Payer: Self-pay | Admitting: Physician Assistant

## 2019-10-02 NOTE — Telephone Encounter (Signed)
I'm having numbness in part of the left side of my face and i just had a teledoc call.  He had me do a few things and then told me that it sounds like Bell's Palsy and to see my PCP if it doesnt go away in 12-24 hrs.  This started on Saturday morning - i woke up with an intense numbness that hurt.  Its gotten better since then but still is constant.  I was hoping you might have an opening tomorrow sometime   Pt sent a mychart appt request.

## 2019-10-02 NOTE — Telephone Encounter (Signed)
Pt was scheduled a VV at her preference.

## 2019-10-02 NOTE — Telephone Encounter (Signed)
Pt needs either a video visit or in-office visit tomorrow w/ PCP

## 2019-10-02 NOTE — Telephone Encounter (Signed)
Spoke with pt she had a teledoc appt about an hour ago. They only did a phone call. Had pt smile big, raise eyebrows, etc. Doc said that he suspected it was Bells Palsy. Pt was not prescribed anything since provider was not 100% sure on the diagnosis.   Started Saturday morning. Pt woke up and left side of face was numb to the point it hurt. Not as bad today as it had been. Constant numbness feeling, especially left top corner of mouth/lip. Pt states face does not look different. Denies any cognitive changes. Can speak and think clearly. Denies any issues with eating or drinking.   Pt states best pharmacy is [facility] on [address].

## 2019-10-03 ENCOUNTER — Ambulatory Visit (INDEPENDENT_AMBULATORY_CARE_PROVIDER_SITE_OTHER): Payer: 59 | Admitting: Physician Assistant

## 2019-10-03 ENCOUNTER — Other Ambulatory Visit: Payer: Self-pay

## 2019-10-03 ENCOUNTER — Encounter: Payer: Self-pay | Admitting: Physician Assistant

## 2019-10-03 DIAGNOSIS — R200 Anesthesia of skin: Secondary | ICD-10-CM | POA: Diagnosis not present

## 2019-10-03 MED ORDER — METHYLPREDNISOLONE 4 MG PO TBPK: ORAL_TABLET | ORAL | 0 refills | Status: DC

## 2019-10-03 NOTE — Progress Notes (Signed)
I have discussed the procedure for the virtual visit with the patient who has given consent to proceed with assessment and treatment.   Alexandra Mcdaniel S Miliano Cotten, CMA     

## 2019-10-03 NOTE — Progress Notes (Signed)
   Virtual Visit via Video   I connected with patient on 10/03/19 at 11:00 AM EST by a video enabled telemedicine application and verified that I am speaking with the correct person using two identifiers.  Location patient: Home Location provider: [facility], Office Persons participating in the virtual visit: Patient, Provider, PA-Student Drucilla Schmidt), CMA (Eduard Clos)  I discussed the limitations of evaluation and management by telemedicine and the availability of in person appointments. The patient expressed understanding and agreed to proceed.  Subjective:   HPI:   Patient presents via Doxy[facility]Me today c/o left-sided facial pain. Began Saturday morning, woke up with numbness of left side of face with pain, "pins and needles" sensation. Did a telehealth visit who recommend she follow-up with PCP for evaluation of Bell's Palsy. Since Saturday, numbness and tingling has improved, feels less severe today. States when she smiles it looks less symmetrical with more drooping of left side of mouth. Denies difficulty eating or swallowing, weakness of arms or legs, blurry or double vision, tinnitus, ear pain. Denies chest pain, palpitations, shortness of breath, dizziness, lightheadedness. No preceding URI-like symptoms, no known sick contacts. No history of similar symptoms.   ROS:   See pertinent positives and negatives per HPI.  Patient Active Problem List   Diagnosis Date Noted  . Inattention 06/30/2019  . Adjustment reaction 06/30/2019  . History of narcotic addiction ([facility]) 06/30/2019    Social History   Tobacco Use  . Smoking status: Former Research scientist (life sciences)  . Smokeless tobacco: Never Used  Substance Use Topics  . Alcohol use: Yes    Comment: Occas    Current Outpatient Medications:  .  buPROPion (WELLBUTRIN XL) 150 MG 24 hr tablet, Take 1 tablet (150 mg total) by mouth daily., Disp: 30 tablet, Rfl: 1 .  fluticasone (FLONASE) 50 MCG/ACT nasal spray, Place 2 sprays into both  nostrils daily., Disp: , Rfl:  .  norethindrone-ethinyl estradiol (LOESTRIN 1/20, 21,) 1-20 MG-MCG tablet, Take 1 tablet by mouth daily., Disp: 1 Package, Rfl: 11 .  fexofenadine (ALLEGRA) 180 MG tablet, Take 180 mg by mouth daily., Disp: , Rfl:  .  methylPREDNISolone (MEDROL DOSEPAK) 4 MG TBPK tablet, Take following package directions, Disp: 21 tablet, Rfl: 0 .  valACYclovir (VALTREX) 1000 MG tablet, Take 2 tablets (2,000 mg total) by mouth 2 (two) times daily. (Patient not taking: Reported on 10/03/2019), Disp: 4 tablet, Rfl: 0  No Known Allergies  Objective:   There were no vitals taken for this visit.  Patient is well-developed, well-nourished in no acute distress.  Resting comfortably at home.  Head is normocephalic, atraumatic.  No labored breathing.  Speech is clear and coherent with logical content. No focal neurological deficit. Facial motor function intact. Facial sensation intact.  Patient is alert and oriented at baseline.    Assessment and Plan:   1. Facial numbness Patient with improving left-sided facial numbness and tingling. Facial motor function and facial sensation intact today, even with physical exam limited by video visit. No notable focal neurological deficits, low concern for CVA. Suspect mild Bell's palsy versus trigeminal nerve inflammation. Will start Medrol Dosepak to help relieve residual inflammation. Strict return and ER precautions discussed with patient.   Leeanne Rio, PA-C 10/03/2019

## 2019-10-11 ENCOUNTER — Other Ambulatory Visit: Payer: Self-pay | Admitting: Physician Assistant

## 2019-11-07 ENCOUNTER — Encounter: Payer: Self-pay | Admitting: Physician Assistant

## 2019-12-26 ENCOUNTER — Encounter: Payer: Self-pay | Admitting: Physician Assistant

## 2019-12-26 MED ORDER — BUPROPION HCL ER (XL) 300 MG PO TB24: 300.0000 mg | ORAL_TABLET | Freq: Every day | ORAL | 0 refills | Status: DC

## 2020-02-08 ENCOUNTER — Ambulatory Visit: Payer: 59 | Attending: Internal Medicine

## 2020-02-08 DIAGNOSIS — Z23 Encounter for immunization: Secondary | ICD-10-CM

## 2020-02-08 NOTE — Progress Notes (Signed)
   Covid-19 Vaccination Clinic  Name:  Alexandra Mcdaniel    MRN: [id number] DOB: [birth year]  02/08/2020  Ms. Alexandra Mcdaniel was observed post Covid-19 immunization for 15 minutes without incident. She was provided with Vaccine Information Sheet and instruction to access the V-Safe system.   Ms. Alexandra Mcdaniel was instructed to call 911 with any severe reactions post vaccine: Marland Kitchen Difficulty breathing  . Swelling of face and throat  . A fast heartbeat  . A bad rash all over body  . Dizziness and weakness   Immunizations Administered    Name Date Dose VIS Date Route   Pfizer COVID-19 Vaccine 02/08/2020  2:11 PM 0.3 mL 10/27/2019 Intramuscular   Manufacturer: [employer], [employer]   Lot: [id number]   NDC: [address]-1

## 2020-03-04 ENCOUNTER — Ambulatory Visit: Payer: 59 | Attending: Internal Medicine

## 2020-03-04 DIAGNOSIS — Z23 Encounter for immunization: Secondary | ICD-10-CM

## 2020-03-04 NOTE — Progress Notes (Signed)
   Covid-19 Vaccination Clinic  Name:  Alexandra Mcdaniel    MRN: [id number] DOB: 07-Dec-1980  03/04/2020  Ms. [name] was observed post Covid-19 immunization for 15 minutes without incident. She was provided with Vaccine Information Sheet and instruction to access the V-Safe system.   Ms. [name] was instructed to call 911 with any severe reactions post vaccine: Marland Kitchen Difficulty breathing  . Swelling of face and throat  . A fast heartbeat  . A bad rash all over body  . Dizziness and weakness   Immunizations Administered    Name Date Dose VIS Date Route   Pfizer COVID-19 Vaccine 03/04/2020 12:30 PM 0.3 mL 01/10/2019 Intramuscular   Manufacturer: [employer], [employer]   Lot: [id number]   NDC: [address]-7

## 2020-03-13 ENCOUNTER — Other Ambulatory Visit: Payer: Self-pay | Admitting: Emergency Medicine

## 2020-03-13 DIAGNOSIS — F4320 Adjustment disorder, unspecified: Secondary | ICD-10-CM

## 2020-03-13 MED ORDER — BUPROPION HCL ER (XL) 300 MG PO TB24: 300.0000 mg | ORAL_TABLET | Freq: Every day | ORAL | 1 refills | Status: DC

## 2020-03-26 ENCOUNTER — Encounter: Payer: Self-pay | Admitting: Physician Assistant

## 2020-03-26 ENCOUNTER — Telehealth (INDEPENDENT_AMBULATORY_CARE_PROVIDER_SITE_OTHER): Payer: 59 | Admitting: Physician Assistant

## 2020-03-26 ENCOUNTER — Other Ambulatory Visit: Payer: Self-pay

## 2020-03-26 DIAGNOSIS — F329 Major depressive disorder, single episode, unspecified: Secondary | ICD-10-CM

## 2020-03-26 DIAGNOSIS — F419 Anxiety disorder, unspecified: Secondary | ICD-10-CM

## 2020-03-26 DIAGNOSIS — F32A Depression, unspecified: Secondary | ICD-10-CM

## 2020-03-26 MED ORDER — SERTRALINE HCL 25 MG PO TABS: 25.0000 mg | ORAL_TABLET | Freq: Every day | ORAL | 2 refills | Status: DC

## 2020-03-26 MED ORDER — HYDROXYZINE HCL 10 MG PO TABS: 10.0000 mg | ORAL_TABLET | Freq: Three times a day (TID) | ORAL | 0 refills | Status: DC | PRN

## 2020-03-26 NOTE — Patient Instructions (Signed)
Instructions sent to MyChart

## 2020-03-26 NOTE — Progress Notes (Signed)
I have discussed the procedure for the virtual visit with the patient who has given consent to proceed with assessment and treatment. Patient unable to get any vital signs.  Sonja Manseau N Milady Fleener, LPN     

## 2020-03-26 NOTE — Progress Notes (Signed)
   Virtual Visit via Video   I connected with patient on 03/26/20 at  3:30 PM EDT by a video enabled telemedicine application and verified that I am speaking with the correct person using two identifiers.  Location patient: Home Location provider: [facility], Office Persons participating in the virtual visit: Patient, Provider, CMA (Patina Moore)  I discussed the limitations of evaluation and management by telemedicine and the availability of in person appointments. The patient expressed understanding and agreed to proceed.  Subjective:   HPI:   Patient presents via Caregility today to discuss increased anxiety levels over the past 2 months.  Patient states she notes significant increased in general sense of stress and anxiety, feeling overwhelmed most days.  Has had occasional more intense acute anxiety, but denies having a "traditional panic attack".  States it can be just about anything that seems to make her feel overwhelmed.  Notes some anhedonia but denies significant depressed mood, feeling with that portion has been well controlled with her Wellbutrin 300 mg once daily.  Denies suicidal thought or ideation.  ROS:   See pertinent positives and negatives per HPI.  Patient Active Problem List   Diagnosis Date Noted  . Inattention 06/30/2019  . Adjustment reaction 06/30/2019  . History of narcotic addiction ([facility]) 06/30/2019    Social History   Tobacco Use  . Smoking status: Former Games developer  . Smokeless tobacco: Never Used  Substance Use Topics  . Alcohol use: Yes    Comment: Occas    Current Outpatient Medications:  .  buPROPion (WELLBUTRIN XL) 300 MG 24 hr tablet, Take 1 tablet (300 mg total) by mouth daily., Disp: 90 tablet, Rfl: 1 .  fexofenadine (ALLEGRA) 180 MG tablet, Take 180 mg by mouth daily., Disp: , Rfl:  .  fluticasone (FLONASE) 50 MCG/ACT nasal spray, Place 2 sprays into both nostrils daily., Disp: , Rfl:  .  norethindrone-ethinyl estradiol (LOESTRIN  1/20, 21,) 1-20 MG-MCG tablet, Take 1 tablet by mouth daily., Disp: 1 Package, Rfl: 11 .  methylPREDNISolone (MEDROL DOSEPAK) 4 MG TBPK tablet, Take following package directions (Patient not taking: Reported on 03/26/2020), Disp: 21 tablet, Rfl: 0 .  valACYclovir (VALTREX) 1000 MG tablet, Take 2 tablets (2,000 mg total) by mouth 2 (two) times daily. (Patient not taking: Reported on 10/03/2019), Disp: 4 tablet, Rfl: 0  No Known Allergies  Objective:   There were no vitals taken for this visit.  Patient is well-developed, well-nourished in no acute distress.  Resting comfortably  at home.  Head is normocephalic, atraumatic.  No labored breathing.  Speech is clear and coherent with logical content.  Patient is alert and oriented at baseline.   Assessment and Plan:   1. Anxiety and depression We will continue Wellbutrin at current dose.  Will augment therapy by adding 25 mg sertraline daily.  Rx hydroxyzine to be used as directed if needed for more severe acute anxiety.  Supportive measures reviewed.  Mindfulness training and deep breathing exercises recommended and reviewed with patient.  Copy of written instructions sent to patient's my chart for review.  Follow-up 4 to 6 weeks.  Sooner if needed. - sertraline (ZOLOFT) 25 MG tablet; Take 1 tablet (25 mg total) by mouth daily.  Dispense: 30 tablet; Refill: 2 - hydrOXYzine (ATARAX/VISTARIL) 10 MG tablet; Take 1 tablet (10 mg total) by mouth every 8 (eight) hours as needed for anxiety.  Dispense: 30 tablet; Refill: 0    Piedad Climes, [place] 03/26/2020

## 2020-05-09 ENCOUNTER — Other Ambulatory Visit: Payer: Self-pay | Admitting: Physician Assistant

## 2020-05-09 MED ORDER — NORETHINDRONE ACET-ETHINYL EST 1-20 MG-MCG PO TABS: 1.0000 | ORAL_TABLET | Freq: Every day | ORAL | 11 refills | Status: DC

## 2020-06-12 ENCOUNTER — Telehealth (INDEPENDENT_AMBULATORY_CARE_PROVIDER_SITE_OTHER): Payer: 59 | Admitting: Family Medicine

## 2020-06-12 ENCOUNTER — Other Ambulatory Visit: Payer: Self-pay

## 2020-06-12 ENCOUNTER — Ambulatory Visit: Payer: 59 | Admitting: Physician Assistant

## 2020-06-12 ENCOUNTER — Encounter: Payer: Self-pay | Admitting: Family Medicine

## 2020-06-12 VITALS — BP 138/98 | HR 101 | Ht 65.0 in | Wt 142.1 lb

## 2020-06-12 DIAGNOSIS — F419 Anxiety disorder, unspecified: Secondary | ICD-10-CM | POA: Diagnosis not present

## 2020-06-12 DIAGNOSIS — F32A Depression, unspecified: Secondary | ICD-10-CM

## 2020-06-12 DIAGNOSIS — F329 Major depressive disorder, single episode, unspecified: Secondary | ICD-10-CM

## 2020-06-12 DIAGNOSIS — R030 Elevated blood-pressure reading, without diagnosis of hypertension: Secondary | ICD-10-CM | POA: Diagnosis not present

## 2020-06-12 MED ORDER — CITALOPRAM HYDROBROMIDE 10 MG PO TABS: 10.0000 mg | ORAL_TABLET | Freq: Every day | ORAL | 1 refills | Status: DC

## 2020-06-12 NOTE — Progress Notes (Signed)
Virtual Visit via Video   I connected with patient on 06/12/20 at  4:00 PM EDT by a video enabled telemedicine application and verified that I am speaking with the correct person using two identifiers.  Location patient: Home Location provider: [facility], Office Persons participating in the virtual visit: Patient, Provider, CMA (Jess B)  I discussed the limitations of evaluation and management by telemedicine and the availability of in person appointments. The patient expressed understanding and agreed to proceed.  Subjective:   HPI:   Elevated BP- pt reports she has been having dental work and BP has been elevated.  Began checking BP at home and it has been running 140-160s/100-110.  BP has been elevated for the last month.  Has started exercising, changing diet from Keto to Mediterranean.  Denies increased stress but her home situation is very stressful w/ 2 stepdaughters and her own daughter.  No CP, SOB.  Some 'tingling' in arms and hands.  No HAs, visual changes.  + family hx HTN- dad, brother.  Anxiety- pt stopped her Sertraline and Hydroxyzine.  Felt that the Sertraline caused her to 'eat so much' and hydroxyzine made her feel 'balloon headed'.  Currently on Wellbutrin 300mg  daily.  Doesn't feel anxiety is well controlled.  Previously on Buspar- 'that was awful'.  Took Ativan w/ some relief.  Has not been on Citalopram previously.    ROS:   See pertinent positives and negatives per HPI.  Patient Active Problem List   Diagnosis Date Noted  . Inattention 06/30/2019  . Adjustment reaction 06/30/2019  . History of narcotic addiction (HCC) 06/30/2019    Social History   Tobacco Use  . Smoking status: Former 07/02/2019  . Smokeless tobacco: Never Used  Substance Use Topics  . Alcohol use: Yes    Comment: Occas    Current Outpatient Medications:  .  buPROPion (WELLBUTRIN XL) 300 MG 24 hr tablet, Take 1 tablet (300 mg total) by mouth daily., Disp: 90 tablet, Rfl: 1 .   fexofenadine (ALLEGRA) 180 MG tablet, Take 180 mg by mouth daily., Disp: , Rfl:  .  fluticasone (FLONASE) 50 MCG/ACT nasal spray, Place 2 sprays into both nostrils daily., Disp: , Rfl:  .  norethindrone-ethinyl estradiol (LOESTRIN 1/20, 21,) 1-20 MG-MCG tablet, Take 1 tablet by mouth daily., Disp: 28 tablet, Rfl: 11 .  hydrOXYzine (ATARAX/VISTARIL) 10 MG tablet, Take 1 tablet (10 mg total) by mouth every 8 (eight) hours as needed for anxiety. (Patient not taking: Reported on 06/12/2020), Disp: 30 tablet, Rfl: 0 .  sertraline (ZOLOFT) 25 MG tablet, Take 1 tablet (25 mg total) by mouth daily. (Patient not taking: Reported on 06/12/2020), Disp: 30 tablet, Rfl: 2 .  valACYclovir (VALTREX) 1000 MG tablet, Take 2 tablets (2,000 mg total) by mouth 2 (two) times daily. (Patient not taking: Reported on 10/03/2019), Disp: 4 tablet, Rfl: 0  No Known Allergies  Objective:   BP (!) 138/98   Pulse 101   Ht 5\' 5"  (1.651 m)   Wt 142 lb 2 oz (64.5 kg)   BMI 23.65 kg/m   AAOx3, NAD NCAT, EOMI No obvious CN deficits Coloring WNL Pt is able to speak clearly, coherently without shortness of breath or increased work of breathing.  Thought process is linear.  Mood is appropriate.   Assessment and Plan:   Elevated BP- new.  This may be multifactorial as there is a family hx of HTN and she has under treated anxiety at this time.  We will try  and improve the anxiety first in the hopes that her BP will normalize as anxiety decreases.  Pt will follow up in 3-4 weeks to recheck BP and anxiety and she will continue to monitor BP at home.  Anxiety- deteriorated.  Pt is no longer taking Sertraline or Hydroxyzine due to side effects.  Her anxiety is not currently well controlled so will start Citalopram 10mg  daily and follow closely for improvement.  Pt expressed understanding and is in agreement w/ plan.    , MD 06/12/2020

## 2020-06-12 NOTE — Progress Notes (Signed)
I have discussed the procedure for the virtual visit with the patient who has given consent to proceed with assessment and treatment.   Marcin Holte L Aerika Groll, CMA     

## 2020-06-18 ENCOUNTER — Ambulatory Visit: Payer: 59 | Admitting: Physician Assistant

## 2020-06-20 ENCOUNTER — Telehealth: Payer: Self-pay | Admitting: Physician Assistant

## 2020-06-20 NOTE — Telephone Encounter (Signed)
Patient is asking for a TOC from Alexandra Mcdaniel to [facility] because she is kind of concerned about some of the medications that are not really helping and [facility] is closer to her home.

## 2020-06-20 NOTE — Telephone Encounter (Signed)
Good with me 

## 2020-06-24 NOTE — Telephone Encounter (Signed)
Not at this time. Im really sorry.  Orland Mustard, MD [facility]

## 2020-06-24 NOTE — Telephone Encounter (Signed)
I'm pretty full right now. Would recommend another provider.  Thank you.

## 2020-06-24 NOTE — Telephone Encounter (Signed)
Patient is asking if Dr.Wolfe would be willing to take her on as a patient. Please advise

## 2020-06-25 NOTE — Telephone Encounter (Signed)
Patient was notified.

## 2020-07-08 ENCOUNTER — Encounter: Payer: Self-pay | Admitting: Physician Assistant

## 2020-07-08 ENCOUNTER — Telehealth (INDEPENDENT_AMBULATORY_CARE_PROVIDER_SITE_OTHER): Payer: 59 | Admitting: Physician Assistant

## 2020-07-08 ENCOUNTER — Other Ambulatory Visit: Payer: Self-pay

## 2020-07-08 DIAGNOSIS — R030 Elevated blood-pressure reading, without diagnosis of hypertension: Secondary | ICD-10-CM

## 2020-07-08 DIAGNOSIS — F411 Generalized anxiety disorder: Secondary | ICD-10-CM

## 2020-07-08 MED ORDER — CITALOPRAM HYDROBROMIDE 20 MG PO TABS: 20.0000 mg | ORAL_TABLET | Freq: Every day | ORAL | 3 refills | Status: DC

## 2020-07-08 NOTE — Patient Instructions (Signed)
Instructions sent to MyChart

## 2020-07-08 NOTE — Progress Notes (Signed)
I have discussed the procedure for the virtual visit with the patient who has given consent to proceed with assessment and treatment.   Tanairy Payeur S Emanuela Runnion, CMA     

## 2020-07-08 NOTE — Progress Notes (Signed)
   Virtual Visit via Video   I connected with patient on 07/08/20 at  3:30 PM EDT by a video enabled telemedicine application and verified that I am speaking with the correct person using two identifiers.  Location patient: Home Location provider: [place] Mcdaniel, Office Persons participating in the virtual visit: Patient, Provider, CMA (Patina Moore)  I discussed the limitations of evaluation and management by telemedicine and the availability of in person appointments. The patient expressed understanding and agreed to proceed.  Subjective:   HPI:   Patient presents via [facility] today for follow-up of anxiety and elevated BP 2/2 anxiety levels. Was seen last month by Dr. Beverely Low in my absence. Was started on Citalopram 10 mg daily to help with anxiety. Was previously on Sertraline but stopped due to mild side effects. Patient endorses taking medication as directed. Notes she is tolerating well but has not noted significant improvement in symptoms. Denies depressed mood, anhedonia, SI/HI.  In regards to blood pressure, patient endorses following a low-salt diet.  Has been monitoring her blood pressure.  Notes systolic blood pressure in the 120s.  Diastolic can sometimes be normal or sometimes elevated up to low 90s.  Patient denies any symptoms associated with elevated blood pressure.  ROS:   See pertinent positives and negatives per HPI.  Patient Active Problem List   Diagnosis Date Noted  . Inattention 06/30/2019  . Adjustment reaction 06/30/2019  . History of narcotic addiction (HCC) 06/30/2019    Social History   Tobacco Use  . Smoking status: Former Games developer  . Smokeless tobacco: Never Used  Substance Use Topics  . Alcohol use: Yes    Comment: Occas    Current Outpatient Medications:  .  buPROPion (WELLBUTRIN XL) 300 MG 24 hr tablet, Take 1 tablet (300 mg total) by mouth daily., Disp: 90 tablet, Rfl: 1 .  citalopram (CELEXA) 10 MG tablet, Take 1 tablet (10 mg total)  by mouth daily., Disp: 30 tablet, Rfl: 1 .  fexofenadine (ALLEGRA) 180 MG tablet, Take 180 mg by mouth daily., Disp: , Rfl:  .  fluticasone (FLONASE) 50 MCG/ACT nasal spray, Place 2 sprays into both nostrils daily., Disp: , Rfl:  .  norethindrone-ethinyl estradiol (LOESTRIN 1/20, 21,) 1-20 MG-MCG tablet, Take 1 tablet by mouth daily., Disp: 28 tablet, Rfl: 11  No Known Allergies  Objective:   There were no vitals taken for this visit.  Patient is well-developed, well-nourished in no acute distress.  Resting comfortably at home.  Head is normocephalic, atraumatic.  No labored breathing.  Speech is clear and coherent with logical content.  Patient is alert and oriented at baseline.   Assessment and Plan:   1. Generalized anxiety disorder Tolerating citalopram without side effect.  Not noticing a therapeutic effect.  Home care instructions reviewed.  Recommend mindfulness training.  Increase citalopram to 20 mg daily.  Follow-up 4 weeks in office.  2. Elevated blood pressure reading in office without diagnosis of hypertension Still feel this is likely secondary to her anxiety state.  Hopefully this will improve as her get better control over her anxiety with recent medication change.  Asymptomatic regarding hypertension.  No indication for BP medication at present time.  We will have her continue DASH diet.  In office follow-up scheduled.    Alexandra Mcdaniel, [place] 07/08/2020

## 2020-07-11 ENCOUNTER — Other Ambulatory Visit: Payer: Self-pay | Admitting: Emergency Medicine

## 2020-07-11 DIAGNOSIS — F411 Generalized anxiety disorder: Secondary | ICD-10-CM

## 2020-07-11 MED ORDER — LORAZEPAM 0.5 MG PO TABS: 0.5000 mg | ORAL_TABLET | Freq: Two times a day (BID) | ORAL | 0 refills | Status: DC | PRN

## 2020-07-11 MED ORDER — FLUOXETINE HCL 20 MG PO CAPS: 20.0000 mg | ORAL_CAPSULE | Freq: Every day | ORAL | 3 refills | Status: DC

## 2020-07-11 MED ORDER — FLUOXETINE HCL 20 MG PO TABS: 20.0000 mg | ORAL_TABLET | Freq: Every day | ORAL | 3 refills | Status: DC

## 2020-08-30 ENCOUNTER — Encounter: Payer: Self-pay | Admitting: Physician Assistant

## 2020-08-30 DIAGNOSIS — Z1231 Encounter for screening mammogram for malignant neoplasm of breast: Secondary | ICD-10-CM

## 2020-09-02 ENCOUNTER — Other Ambulatory Visit: Payer: Self-pay

## 2020-09-02 ENCOUNTER — Encounter: Payer: Self-pay | Admitting: Physician Assistant

## 2020-09-02 ENCOUNTER — Encounter (HOSPITAL_COMMUNITY): Payer: Self-pay | Admitting: *Deleted

## 2020-09-02 ENCOUNTER — Ambulatory Visit (HOSPITAL_COMMUNITY)
Admission: EM | Admit: 2020-09-02 | Discharge: 2020-09-02 | Disposition: A | Discharge status: Home / Self Care | Payer: 59 | Attending: Family Medicine | Admitting: Family Medicine

## 2020-09-02 DIAGNOSIS — S61411A Laceration without foreign body of right hand, initial encounter: Secondary | ICD-10-CM | POA: Diagnosis not present

## 2020-09-02 MED ORDER — LIDOCAINE HCL (PF) 1 % IJ SOLN
INTRAMUSCULAR | Status: AC
  Filled 2020-09-02: qty 30

## 2020-09-02 MED ORDER — TETANUS-DIPHTH-ACELL PERTUSSIS 5-2.5-18.5 LF-MCG/0.5 IM SUSP
0.5000 mL | Freq: Once | INTRAMUSCULAR | Status: AC
  Administered 2020-09-02: 0.5 mL via INTRAMUSCULAR

## 2020-09-02 MED ORDER — TETANUS-DIPHTH-ACELL PERTUSSIS 5-2.5-18.5 LF-MCG/0.5 IM SUSP
INTRAMUSCULAR | Status: AC
  Filled 2020-09-02: qty 0.5

## 2020-09-02 NOTE — Discharge Instructions (Addendum)
Thank you for coming in to see [place] today! Please see below to review our plan for today's visit:   1. You received a Tdap (Tetanus) shot at today's visit. Your hand laceration was sutured together with non-absorbable sutures that can come out in 10-14 days! 2. Continue to wash the hand once daily in the shower with soap and water, then replace bandages and Neosporin to keep wound clean and covered. Look out for any signs of infection such as drainage of green/yellow  3. Follow up with your primary care doc in about 10-14 days for removal.   Please call the clinic at [phone] if your symptoms worsen or you have any concerns. It was our pleasure to serve you!   Dr. Peggyann Shoals [facility]

## 2020-09-02 NOTE — ED Notes (Signed)
Pt left DC papers . TC to Pt to relate she needs to return in 10-14 days for suture removal. . Pt instructed to watch for signs of infection at incision site such as redness ,yellow /green drainage.  Pt instructed to clean incision daily  In shower with soap and water. Pt should apply neosporin with dry dsy.

## 2020-09-02 NOTE — ED Triage Notes (Signed)
Pt reports while washing dishes she cut her Rt hand on a cracked glass. Lac to palm of RT hand . Bleeding controlled on arrival to UC. Pt reports needing a tDAP .

## 2020-09-02 NOTE — ED Provider Notes (Signed)
[facility]    CSN: [id number] Arrival date & time: 09/02/20  1345      History   Chief Complaint Chief Complaint  Patient presents with  . Hand Injury    HPI Alexandra Mcdaniel is a 39 y.o. female reporting to urgent care clinic after she cut her hand this morning around 10:00 in the morning.  She reports she was washing dishes and picked up a glass she did not realize was cracked, went to clean it with a sponge and cut the base of her thumb/thenar eminence.  She was able to have good control of the bleeding, her husband commits her to come to urgent care reporting that she might need stitches.  Additionally, the patient also needs a Tdap shot, cannot remember when she last had one.  Rates the pain a 4/10, has not taken any medications to control the pain.  Additionally heart rate elevated at 128 bpm and blood pressure 152/101, most likely due to acute pain.  HPI  Past Medical History:  Diagnosis Date  . Allergy   . History of chickenpox     Patient Active Problem List   Diagnosis Date Noted  . Inattention 06/30/2019  . Adjustment reaction 06/30/2019  . History of narcotic addiction (HCC) 06/30/2019    Past Surgical History:  Procedure Laterality Date  . CESAREAN SECTION  2017    OB History    Gravida  2   Para  1   Term      Preterm      AB  1   Living  1     SAB      TAB  1   Ectopic      Multiple      Live Births              Home Medications    Prior to Admission medications   Medication Sig Start Date End Date Taking? Authorizing Provider  buPROPion (WELLBUTRIN XL) 300 MG 24 hr tablet Take 1 tablet (300 mg total) by mouth daily. 03/13/20  Yes Waldon Merl, PA-C  fexofenadine (ALLEGRA) 180 MG tablet Take 180 mg by mouth daily.   Yes [provider]  fluticasone (FLONASE) 50 MCG/ACT nasal spray Place 2 sprays into both nostrils daily.   Yes [provider]  LORazepam (ATIVAN) 0.5 MG tablet Take 1 tablet (0.5 mg  total) by mouth 2 (two) times daily as needed for anxiety. 07/11/20  Yes Waldon Merl, PA-C  norethindrone-ethinyl estradiol (LOESTRIN 1/20, 21,) 1-20 MG-MCG tablet Take 1 tablet by mouth daily. 05/09/20  Yes Waldon Merl, PA-C  citalopram (CELEXA) 20 MG tablet Take 1 tablet (20 mg total) by mouth daily. 07/08/20   Waldon Merl, PA-C  FLUoxetine (PROZAC) 20 MG capsule Take 1 capsule (20 mg total) by mouth daily. 07/11/20   Waldon Merl, PA-C    Family History Family History  Problem Relation Age of Onset  . Kidney failure Mother        secondary to flu-like illness and medication overuse. Not cause of death. Had to have renal transplant.   . Hypertension Mother        2/2 renal issues  . Stroke Father     Social History Social History   Tobacco Use  . Smoking status: Former Games developer  . Smokeless tobacco: Never Used  Vaping Use  . Vaping Use: Former  Substance Use Topics  . Alcohol use: Yes    Comment: Occas  .  Drug use: Never    Allergies   Patient has no known allergies.  Review of Systems Review of Systems - see HPI   Physical Exam Triage Vital Signs ED Triage Vitals  Enc Vitals Group     BP 09/02/20 1454 (!) 152/101     Pulse Rate 09/02/20 1454 (!) 128     Resp 09/02/20 1454 18     Temp 09/02/20 1454 99 F (37.2 C)     Temp Source 09/02/20 1454 Oral     SpO2 09/02/20 1454 97 %     Weight 09/02/20 1458 140 lb (63.5 kg)     Height 09/02/20 1458 5\' 5"  (1.651 m)     Head Circumference --      Peak Flow --      Pain Score 09/02/20 1457 4     Pain Loc --      Pain Edu? --      Excl. in GC? --    No data found.  Updated Vital Signs BP (!) 152/101 (BP Location: Left Arm)   Pulse (!) 128   Temp 99 F (37.2 C) (Oral)   Resp 18   Ht 5\' 5"  (1.651 m)   Wt 140 lb (63.5 kg)   LMP 09/02/2020   SpO2 97%   BMI 23.30 kg/m   Physical exam: General: well appearing, very pleasant Respiratory: speaking in complete sentences, comfortable work of  breathing Right Hand: Inspection: 1.3 cm laceration appreciated to R thenar eminence, approximately 3-4 mm deep with oblique cut through skin, does not involve any underlying blood vasculature or tendons, mild swelling appreciated ROM: good ROM of thumb in all planes Strength: 5/5 strength in the forearm, wrist and interosseus muscles Neurovascular: NV intact        UC Treatments / Results  Labs (all labs ordered are listed, but only abnormal results are displayed) Labs Reviewed - No data to display  EKG   Radiology No results found.  Procedures Laceration Repair  Date/Time: 09/02/2020 5:08 PM Performed by: 09/04/2020, DO Authorized by: 09/04/2020, MD   Consent:    Consent obtained:  Verbal   Consent given by:  Patient   Risks discussed:  Infection, retained foreign body, poor cosmetic result and poor wound healing   Alternatives discussed:  No treatment and observation Anesthesia (see MAR for exact dosages):    Anesthesia method:  Local infiltration   Local anesthetic:  Lidocaine 1% w/o epi Laceration details:    Location:  Hand   Hand location:  R palm   Length (cm):  1.3   Depth (mm):  3 Pre-procedure details:    Preparation:  Patient was prepped and draped in usual sterile fashion Exploration:    Hemostasis achieved with:  Direct pressure   Wound exploration: entire depth of wound probed and visualized     Contaminated: no   Treatment:    Area cleansed with:  Betadine and saline   Amount of cleaning:  Extensive   Irrigation solution:  Sterile saline   Irrigation volume:  20cc   Irrigation method:  Pressure wash   Visualized foreign bodies/material removed: no   Skin repair:    Repair method:  Sutures and Steri-Strips   Suture size:  3-0   Suture material:  Nylon   Suture technique:  Simple interrupted   Number of sutures:  2   Number of Steri-Strips:  1 Approximation:    Approximation:  Close Post-procedure details:    Dressing:  Antibiotic ointment, non-adherent dressing and bulky dressing   Patient tolerance of procedure:  Tolerated well, no immediate complications   (including critical care time)  Medications Ordered in UC Medications - No data to display  Initial Impression / Assessment and Plan / UC Course  I have reviewed the triage vital signs and the nursing notes.  Pertinent labs & imaging results that were available during my care of the patient were reviewed by me and considered in my medical decision making (see chart for details).   Hand laceration: Obtained after she cut her hand on a glass. Was well irrigated, no foreign bodies identified. -Laceration repaired with simple interrupted sutures -patient given Tdap shot -asked to follow up with PCP for removal of sutures in 10-14 days -Strict return precautions given regarding signs of infection, poor healing   Final Clinical Impressions(s) / UC Diagnoses   Final diagnoses:  None   Discharge Instructions   None    ED Prescriptions    None     PDMP not reviewed this encounter.   Peggyann Shoals, DO [facility], PGY-3 09/02/2020 3:32 PM    Dollene Cleveland, DO 09/02/20 1714

## 2020-09-14 ENCOUNTER — Ambulatory Visit: Payer: 59

## 2020-09-19 ENCOUNTER — Other Ambulatory Visit: Payer: Self-pay | Admitting: Physician Assistant

## 2020-09-19 ENCOUNTER — Other Ambulatory Visit: Payer: Self-pay | Admitting: Emergency Medicine

## 2020-09-19 DIAGNOSIS — F4320 Adjustment disorder, unspecified: Secondary | ICD-10-CM

## 2020-09-19 MED ORDER — BUPROPION HCL ER (XL) 300 MG PO TB24: 300.0000 mg | ORAL_TABLET | Freq: Every day | ORAL | 1 refills | Status: DC

## 2020-09-19 MED ORDER — LORAZEPAM 0.5 MG PO TABS: 0.5000 mg | ORAL_TABLET | Freq: Two times a day (BID) | ORAL | 0 refills | Status: DC | PRN

## 2020-09-19 NOTE — Telephone Encounter (Signed)
Lorazepam last rx 07/11/20 #20 LOV: 07/08/20 Anxiety No scheduled follow up

## 2020-10-15 ENCOUNTER — Ambulatory Visit (INDEPENDENT_AMBULATORY_CARE_PROVIDER_SITE_OTHER)
Admission: RE | Admit: 2020-10-15 | Discharge: 2020-10-15 | Disposition: A | Discharge status: Home / Self Care | Payer: 59 | Source: Ambulatory Visit | Attending: Physician Assistant | Admitting: Physician Assistant

## 2020-10-15 ENCOUNTER — Other Ambulatory Visit: Payer: Self-pay

## 2020-10-15 ENCOUNTER — Ambulatory Visit: Payer: 59 | Admitting: Physician Assistant

## 2020-10-15 ENCOUNTER — Encounter: Payer: Self-pay | Admitting: Physician Assistant

## 2020-10-15 VITALS — BP 122/80 | HR 87 | Temp 98.3°F | Resp 14 | Ht 65.0 in | Wt 149.0 lb

## 2020-10-15 DIAGNOSIS — G8929 Other chronic pain: Secondary | ICD-10-CM

## 2020-10-15 DIAGNOSIS — F32A Depression, unspecified: Secondary | ICD-10-CM

## 2020-10-15 DIAGNOSIS — M5442 Lumbago with sciatica, left side: Secondary | ICD-10-CM | POA: Diagnosis not present

## 2020-10-15 DIAGNOSIS — F419 Anxiety disorder, unspecified: Secondary | ICD-10-CM

## 2020-10-15 MED ORDER — METHYLPREDNISOLONE 4 MG PO TBPK: ORAL_TABLET | ORAL | 0 refills | Status: DC

## 2020-10-15 MED ORDER — VENLAFAXINE HCL ER 37.5 MG PO CP24: 37.5000 mg | ORAL_CAPSULE | Freq: Every day | ORAL | 0 refills | Status: DC

## 2020-10-15 NOTE — Progress Notes (Signed)
Patient presents to clinic today c/o > 1.5 months of left lower back pain with intermittent radiation into the LLE. Notes pain averages 4/10, worse at night usually and when radiation is present. Denies trauma or injury. Denies change to bowel/bladder habits, saddle anesthesia or numbness/tingling of lower extremity. Has been trying to do stretches which seem to help slightly but the pain always recurs.    Patient would also like to revisit ongoing anxiety symptoms and other options for treatment. Most recently was on fluoxetine but noted this made her very emotional. Has tried and failed other SSRIS and wellbutrin. Denies SI/HI. Takes lorazepam as directed when needed for panic attack.  Past Medical History:  Diagnosis Date  . Allergy   . History of chickenpox     Current Outpatient Medications on File Prior to Visit  Medication Sig Dispense Refill  . buPROPion (WELLBUTRIN XL) 300 MG 24 hr tablet Take 1 tablet (300 mg total) by mouth daily. 90 tablet 1  . fexofenadine (ALLEGRA) 180 MG tablet Take 180 mg by mouth daily.    Marland Kitchen LORazepam (ATIVAN) 0.5 MG tablet Take 1 tablet (0.5 mg total) by mouth 2 (two) times daily as needed for anxiety. 20 tablet 0  . [DISCONTINUED] citalopram (CELEXA) 20 MG tablet Take 1 tablet (20 mg total) by mouth daily. 30 tablet 3  . [DISCONTINUED] FLUoxetine (PROZAC) 20 MG capsule Take 1 capsule (20 mg total) by mouth daily. 30 capsule 3   No current facility-administered medications on file prior to visit.    No Known Allergies  Family History  Problem Relation Age of Onset  . Kidney failure Mother        secondary to flu-like illness and medication overuse. Not cause of death. Had to have renal transplant.   . Hypertension Mother        2/2 renal issues  . Stroke Father     Social History   Socioeconomic History  . Marital status: Married    Spouse name: Not on file  . Number of children: 1  . Years of education: Not on file  . Highest education  level: Not on file  Occupational History  . Not on file  Tobacco Use  . Smoking status: Former Games developer  . Smokeless tobacco: Never Used  Vaping Use  . Vaping Use: Former  Substance and Sexual Activity  . Alcohol use: Yes    Comment: Occas  . Drug use: Never  . Sexual activity: Yes    Birth control/protection: Pill    Comment: 1st intercourse 39 yo-More than 5 partners  Other Topics Concern  . Not on file  Social History Narrative  . Not on file   Social Determinants of Health   Financial Resource Strain:   . Difficulty of Paying Living Expenses: Not on file  Food Insecurity:   . Worried About Programme researcher, broadcasting/film/video in the Last Year: Not on file  . Ran Out of Food in the Last Year: Not on file  Transportation Needs:   . Lack of Transportation (Medical): Not on file  . Lack of Transportation (Non-Medical): Not on file  Physical Activity:   . Days of Exercise per Week: Not on file  . Minutes of Exercise per Session: Not on file  Stress:   . Feeling of Stress : Not on file  Social Connections:   . Frequency of Communication with Friends and Family: Not on file  . Frequency of Social Gatherings with Friends and Family: Not on  file  . Attends Religious Services: Not on file  . Active Member of Clubs or Organizations: Not on file  . Attends Banker Meetings: Not on file  . Marital Status: Not on file   Review of Systems - See HPI.  All other ROS are negative.  BP 122/80   Pulse 87   Temp 98.3 F (36.8 C) (Temporal)   Resp 14   Ht 5\' 5"  (1.651 m)   Wt 149 lb (67.6 kg)   SpO2 98%   BMI 24.79 kg/m   Physical Exam Vitals reviewed.  Constitutional:      Appearance: Normal appearance.  HENT:     Head: Normocephalic and atraumatic.  Cardiovascular:     Rate and Rhythm: Normal rate and regular rhythm.     Pulses: Normal pulses.     Heart sounds: Normal heart sounds.  Pulmonary:     Effort: Pulmonary effort is normal.     Breath sounds: Normal breath  sounds.  Musculoskeletal:     Cervical back: Neck supple.     Lumbar back: No swelling, deformity, spasms, tenderness or bony tenderness. Negative left straight leg raise test.  Neurological:     Mental Status: She is alert.  Psychiatric:        Mood and Affect: Mood normal.      Assessment/Plan: 1. Chronic left-sided low back pain with left-sided sciatica Will proceed with imaging of lumbosacral spine today. Reviewed stretching exercises. Will start Medrol dose pack. If not resolution will need to consider PT versus referral to specialist for further evaluation and management. - DG Lumbar Spine Complete; Future - methylPREDNISolone (MEDROL DOSEPAK) 4 MG TBPK tablet; Take following package directions  Dispense: 21 tablet; Refill: 0  2. Anxiety and depression Discussed options. Will start Effexor XR 37.5 mg daily. Follow-up 6 weeks. Sooner if needed.   This visit occurred during the SARS-CoV-2 public health emergency.  Safety protocols were in place, including screening questions prior to the visit, additional usage of staff PPE, and extensive cleaning of exam room while observing appropriate contact time as indicated for disinfecting solutions.     , PA-C

## 2020-10-15 NOTE — Patient Instructions (Signed)
Please go to our [place] office for x-ray. The address is [address]  [place], [place] (across from [facility]). We will call you with your results.   Take the medrol dose pack as directed. Tylenol if needed for breakthrough pain.  Follow the stretching exercises below.  Let me know if things are not improving/resolving.   For anxiety, start the Effexor daily as directed.  Let's follow-up in 6 weeks to see how things are gong. Sooner if needed.

## 2020-10-16 ENCOUNTER — Other Ambulatory Visit: Payer: Self-pay | Admitting: Emergency Medicine

## 2020-10-16 DIAGNOSIS — M779 Enthesopathy, unspecified: Secondary | ICD-10-CM

## 2020-10-16 DIAGNOSIS — M47816 Spondylosis without myelopathy or radiculopathy, lumbar region: Secondary | ICD-10-CM

## 2020-10-21 ENCOUNTER — Other Ambulatory Visit: Payer: Self-pay | Admitting: Family Medicine

## 2020-10-21 ENCOUNTER — Ambulatory Visit: Payer: 59 | Admitting: Family Medicine

## 2020-10-21 ENCOUNTER — Other Ambulatory Visit: Payer: Self-pay

## 2020-10-21 DIAGNOSIS — G8929 Other chronic pain: Secondary | ICD-10-CM | POA: Diagnosis not present

## 2020-10-21 DIAGNOSIS — M545 Low back pain, unspecified: Secondary | ICD-10-CM

## 2020-10-21 MED ORDER — BACLOFEN 10 MG PO TABS
5.0000 mg | ORAL_TABLET | Freq: Three times a day (TID) | ORAL | 3 refills | Status: DC | PRN
Start: 2020-10-21 — End: 2020-10-21

## 2020-10-21 MED FILL — BACLOFEN 10 MG TABS: 10 | 10 days supply | Qty: 30 | Fill #0
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
## 2020-10-26 ENCOUNTER — Other Ambulatory Visit: Payer: Self-pay | Admitting: Physician Assistant

## 2020-10-28 ENCOUNTER — Other Ambulatory Visit: Payer: Self-pay | Admitting: Physician Assistant

## 2020-10-28 MED ORDER — LORAZEPAM 0.5 MG PO TABS: 0.5000 mg | ORAL_TABLET | Freq: Two times a day (BID) | ORAL | 0 refills | Status: DC | PRN

## 2020-10-28 MED FILL — LORazepam 0.5 MG TABS: 0.5 | 10 days supply | Qty: 20 | Fill #0

## 2020-10-28 NOTE — Telephone Encounter (Signed)
Routed to me in error

## 2020-11-01 ENCOUNTER — Ambulatory Visit: Payer: 59

## 2020-12-04 ENCOUNTER — Other Ambulatory Visit: Payer: Self-pay | Admitting: Physician Assistant

## 2020-12-04 NOTE — Telephone Encounter (Signed)
Lorazepam last rx 10/28/20 #20 LOV: 10/15/20 Chronic back pain, Anxiety and Depression CSC caution

## 2020-12-05 ENCOUNTER — Other Ambulatory Visit: Payer: Self-pay | Admitting: Physician Assistant

## 2020-12-05 MED FILL — LORazepam 0.5 MG TABS: 0.5 | 7 days supply | Qty: 15 | Fill #0

## 2020-12-06 ENCOUNTER — Ambulatory Visit: Payer: 59

## 2020-12-24 MED FILL — BACLOFEN 10 MG TABS: 10 | 10 days supply | Qty: 30 | Fill #1

## 2020-12-31 ENCOUNTER — Other Ambulatory Visit (HOSPITAL_COMMUNITY): Payer: Self-pay | Admitting: Family Medicine

## 2020-12-31 DIAGNOSIS — F331 Major depressive disorder, recurrent, moderate: Secondary | ICD-10-CM | POA: Diagnosis not present

## 2020-12-31 DIAGNOSIS — F411 Generalized anxiety disorder: Secondary | ICD-10-CM | POA: Diagnosis not present

## 2020-12-31 DIAGNOSIS — M545 Low back pain, unspecified: Secondary | ICD-10-CM | POA: Diagnosis not present

## 2020-12-31 DIAGNOSIS — G4700 Insomnia, unspecified: Secondary | ICD-10-CM | POA: Diagnosis not present

## 2020-12-31 MED FILL — ALPRAZolam 0.25 MG TABS: 0.25 | 7 days supply | Qty: 45 | Fill #0

## 2020-12-31 MED FILL — SERTRALINE HCL 50 MG TABLET: 50 | 90 days supply | Qty: 90 | Fill #0

## 2020-12-31 MED FILL — MONTELUKAST SOD 10 MG TAB: 10 | 90 days supply | Qty: 90 | Fill #0

## 2021-01-07 ENCOUNTER — Ambulatory Visit: Payer: BC Managed Care – PPO | Admitting: Family Medicine

## 2021-01-10 ENCOUNTER — Other Ambulatory Visit: Payer: Self-pay | Admitting: Physician Assistant

## 2021-01-17 ENCOUNTER — Ambulatory Visit: Payer: BC Managed Care – PPO

## 2021-01-17 ENCOUNTER — Ambulatory Visit
Admission: RE | Admit: 2021-01-17 | Discharge: 2021-01-17 | Disposition: A | Discharge status: Home / Self Care | Payer: PRIVATE HEALTH INSURANCE | Source: Ambulatory Visit | Attending: Physician Assistant | Admitting: Physician Assistant

## 2021-01-17 ENCOUNTER — Other Ambulatory Visit: Payer: Self-pay

## 2021-01-17 DIAGNOSIS — Z1231 Encounter for screening mammogram for malignant neoplasm of breast: Secondary | ICD-10-CM | POA: Diagnosis not present

## 2021-01-22 ENCOUNTER — Other Ambulatory Visit: Payer: Self-pay | Admitting: Physician Assistant

## 2021-01-22 ENCOUNTER — Other Ambulatory Visit: Payer: Self-pay | Admitting: Family Medicine

## 2021-01-22 DIAGNOSIS — R928 Other abnormal and inconclusive findings on diagnostic imaging of breast: Secondary | ICD-10-CM

## 2021-01-27 ENCOUNTER — Ambulatory Visit: Payer: BC Managed Care – PPO | Admitting: Physician Assistant

## 2021-01-30 DIAGNOSIS — F411 Generalized anxiety disorder: Secondary | ICD-10-CM | POA: Diagnosis not present

## 2021-01-30 DIAGNOSIS — F331 Major depressive disorder, recurrent, moderate: Secondary | ICD-10-CM | POA: Diagnosis not present

## 2021-01-30 DIAGNOSIS — G4700 Insomnia, unspecified: Secondary | ICD-10-CM | POA: Diagnosis not present

## 2021-02-07 ENCOUNTER — Ambulatory Visit
Admission: RE | Admit: 2021-02-07 | Discharge: 2021-02-07 | Disposition: A | Discharge status: Home / Self Care | Payer: BC Managed Care – PPO | Source: Ambulatory Visit | Attending: Physician Assistant | Admitting: Physician Assistant

## 2021-02-07 ENCOUNTER — Ambulatory Visit: Payer: PRIVATE HEALTH INSURANCE

## 2021-02-07 ENCOUNTER — Other Ambulatory Visit: Payer: PRIVATE HEALTH INSURANCE

## 2021-02-07 ENCOUNTER — Other Ambulatory Visit: Payer: Self-pay

## 2021-02-07 DIAGNOSIS — R928 Other abnormal and inconclusive findings on diagnostic imaging of breast: Secondary | ICD-10-CM

## 2021-02-07 DIAGNOSIS — N6489 Other specified disorders of breast: Secondary | ICD-10-CM | POA: Diagnosis not present

## 2021-02-14 ENCOUNTER — Other Ambulatory Visit (HOSPITAL_COMMUNITY): Payer: Self-pay | Admitting: Family Medicine

## 2021-02-17 ENCOUNTER — Other Ambulatory Visit (HOSPITAL_COMMUNITY): Payer: Self-pay

## 2021-02-17 DIAGNOSIS — Z0000 Encounter for general adult medical examination without abnormal findings: Secondary | ICD-10-CM | POA: Diagnosis not present

## 2021-02-17 MED FILL — Bupropion HCl Tab ER 24HR 300 MG: ORAL | 90 days supply | Qty: 90 | Fill #0 | Status: AC

## 2021-02-17 MED FILL — Alprazolam Tab 0.25 MG: ORAL | 5 days supply | Qty: 30 | Fill #0 | Status: AC

## 2021-02-18 ENCOUNTER — Other Ambulatory Visit (HOSPITAL_COMMUNITY): Payer: Self-pay

## 2021-02-19 ENCOUNTER — Other Ambulatory Visit (HOSPITAL_COMMUNITY): Payer: Self-pay

## 2021-02-19 DIAGNOSIS — Z0000 Encounter for general adult medical examination without abnormal findings: Secondary | ICD-10-CM | POA: Diagnosis not present

## 2021-02-19 DIAGNOSIS — F411 Generalized anxiety disorder: Secondary | ICD-10-CM | POA: Diagnosis not present

## 2021-02-19 DIAGNOSIS — Z118 Encounter for screening for other infectious and parasitic diseases: Secondary | ICD-10-CM | POA: Diagnosis not present

## 2021-02-19 DIAGNOSIS — Z01419 Encounter for gynecological examination (general) (routine) without abnormal findings: Secondary | ICD-10-CM | POA: Diagnosis not present

## 2021-02-19 MED ORDER — TRINTELLIX 10 MG PO TABS
10.0000 mg | ORAL_TABLET | Freq: Every day | ORAL | 1 refills | Status: AC
  Filled 2021-02-19 – 2021-03-20 (×2): qty 30, 30d supply, fill #0
  Filled 2021-04-14 – 2021-12-26 (×2): qty 30, 30d supply, fill #1

## 2021-02-24 ENCOUNTER — Other Ambulatory Visit: Payer: Self-pay

## 2021-02-24 ENCOUNTER — Other Ambulatory Visit (HOSPITAL_COMMUNITY): Payer: Self-pay

## 2021-02-28 ENCOUNTER — Other Ambulatory Visit (HOSPITAL_COMMUNITY): Payer: Self-pay

## 2021-03-05 ENCOUNTER — Other Ambulatory Visit: Payer: Self-pay | Admitting: Family Medicine

## 2021-03-06 ENCOUNTER — Other Ambulatory Visit (HOSPITAL_COMMUNITY): Payer: Self-pay

## 2021-03-06 DIAGNOSIS — F331 Major depressive disorder, recurrent, moderate: Secondary | ICD-10-CM | POA: Diagnosis not present

## 2021-03-06 DIAGNOSIS — F411 Generalized anxiety disorder: Secondary | ICD-10-CM | POA: Diagnosis not present

## 2021-03-06 DIAGNOSIS — G4700 Insomnia, unspecified: Secondary | ICD-10-CM | POA: Diagnosis not present

## 2021-03-06 MED ORDER — AMPHETAMINE-DEXTROAMPHETAMINE 10 MG PO TABS
10.0000 mg | ORAL_TABLET | Freq: Every morning | ORAL | 0 refills | Status: DC
  Filled 2021-03-06 (×2): qty 60, 30d supply, fill #0

## 2021-03-06 MED ORDER — ALPRAZOLAM 1 MG PO TABS
0.5000 mg | ORAL_TABLET | Freq: Three times a day (TID) | ORAL | 0 refills | Status: DC | PRN
  Filled 2021-03-06: qty 60, 20d supply, fill #0

## 2021-03-20 ENCOUNTER — Other Ambulatory Visit (HOSPITAL_COMMUNITY): Payer: Self-pay

## 2021-03-31 ENCOUNTER — Other Ambulatory Visit (HOSPITAL_COMMUNITY): Payer: Self-pay

## 2021-03-31 MED ORDER — AMPHETAMINE-DEXTROAMPHETAMINE 10 MG PO TABS
10.0000 mg | ORAL_TABLET | Freq: Two times a day (BID) | ORAL | 0 refills | Status: AC
  Filled 2021-03-31 – 2021-04-03 (×3): qty 60, 30d supply, fill #0

## 2021-03-31 MED ORDER — ALPRAZOLAM 1 MG PO TABS
0.5000 mg | ORAL_TABLET | Freq: Three times a day (TID) | ORAL | 0 refills | Status: DC | PRN
  Filled 2021-03-31: qty 60, 20d supply, fill #0

## 2021-04-03 ENCOUNTER — Other Ambulatory Visit (HOSPITAL_COMMUNITY): Payer: Self-pay

## 2021-04-03 MED FILL — Montelukast Sodium Tab 10 MG (Base Equiv): ORAL | 90 days supply | Qty: 90 | Fill #0 | Status: AC

## 2021-04-15 ENCOUNTER — Other Ambulatory Visit (HOSPITAL_COMMUNITY): Payer: Self-pay

## 2021-04-23 ENCOUNTER — Other Ambulatory Visit (HOSPITAL_COMMUNITY): Payer: Self-pay

## 2021-04-28 ENCOUNTER — Other Ambulatory Visit (HOSPITAL_COMMUNITY): Payer: Self-pay

## 2021-04-28 MED ORDER — BUPROPION HCL ER (XL) 300 MG PO TB24
300.0000 mg | ORAL_TABLET | Freq: Every morning | ORAL | 0 refills | Status: DC
  Filled 2021-04-28: qty 30, 30d supply, fill #0

## 2021-04-28 MED ORDER — HYDROXYZINE HCL 50 MG PO TABS
25.0000 mg | ORAL_TABLET | Freq: Four times a day (QID) | ORAL | 0 refills | Status: AC | PRN
  Filled 2021-04-28: qty 60, 15d supply, fill #0

## 2021-04-28 MED ORDER — DULOXETINE HCL 30 MG PO CPEP
ORAL_CAPSULE | ORAL | 0 refills | Status: DC
  Filled 2021-04-28: qty 42, 28d supply, fill #0

## 2021-04-28 MED ORDER — AMPHETAMINE-DEXTROAMPHETAMINE 15 MG PO TABS
15.0000 mg | ORAL_TABLET | Freq: Two times a day (BID) | ORAL | 0 refills | Status: AC | PRN
  Filled 2021-04-28: qty 60, 30d supply, fill #0

## 2021-05-07 ENCOUNTER — Other Ambulatory Visit (HOSPITAL_COMMUNITY): Payer: Self-pay

## 2021-05-07 MED ORDER — PROPRANOLOL HCL 10 MG PO TABS
10.0000 mg | ORAL_TABLET | Freq: Two times a day (BID) | ORAL | 0 refills | Status: AC | PRN
  Filled 2021-05-07: qty 60, 30d supply, fill #0

## 2021-05-14 ENCOUNTER — Other Ambulatory Visit (HOSPITAL_COMMUNITY): Payer: Self-pay

## 2021-05-14 DIAGNOSIS — F909 Attention-deficit hyperactivity disorder, unspecified type: Secondary | ICD-10-CM | POA: Diagnosis not present

## 2021-05-14 DIAGNOSIS — F329 Major depressive disorder, single episode, unspecified: Secondary | ICD-10-CM | POA: Diagnosis not present

## 2021-05-14 DIAGNOSIS — F410 Panic disorder [episodic paroxysmal anxiety] without agoraphobia: Secondary | ICD-10-CM | POA: Diagnosis not present

## 2021-05-14 DIAGNOSIS — F411 Generalized anxiety disorder: Secondary | ICD-10-CM | POA: Diagnosis not present

## 2021-05-14 MED ORDER — TRAZODONE HCL 50 MG PO TABS
25.0000 mg | ORAL_TABLET | Freq: Every evening | ORAL | 0 refills | Status: DC | PRN
  Filled 2021-05-14: qty 30, 30d supply, fill #0

## 2021-05-14 MED ORDER — BUPROPION HCL ER (SR) 100 MG PO TB12
300.0000 mg | ORAL_TABLET | Freq: Every morning | ORAL | 0 refills | Status: DC
  Filled 2021-05-14: qty 90, 30d supply, fill #0

## 2021-05-14 MED ORDER — GABAPENTIN 100 MG PO CAPS
300.0000 mg | ORAL_CAPSULE | Freq: Every day | ORAL | 0 refills | Status: DC | PRN
  Filled 2021-05-14: qty 90, 30d supply, fill #0

## 2021-05-14 MED ORDER — ALPRAZOLAM 0.25 MG PO TABS
1.0000 mg | ORAL_TABLET | Freq: Every day | ORAL | 0 refills | Status: DC | PRN
  Filled 2021-05-14: qty 120, 30d supply, fill #0

## 2021-05-15 ENCOUNTER — Other Ambulatory Visit (HOSPITAL_COMMUNITY): Payer: Self-pay

## 2021-05-27 ENCOUNTER — Other Ambulatory Visit (HOSPITAL_COMMUNITY): Payer: Self-pay

## 2021-05-27 DIAGNOSIS — F410 Panic disorder [episodic paroxysmal anxiety] without agoraphobia: Secondary | ICD-10-CM | POA: Diagnosis not present

## 2021-05-27 DIAGNOSIS — F331 Major depressive disorder, recurrent, moderate: Secondary | ICD-10-CM | POA: Diagnosis not present

## 2021-05-27 DIAGNOSIS — F909 Attention-deficit hyperactivity disorder, unspecified type: Secondary | ICD-10-CM | POA: Diagnosis not present

## 2021-05-27 DIAGNOSIS — F411 Generalized anxiety disorder: Secondary | ICD-10-CM | POA: Diagnosis not present

## 2021-05-27 MED ORDER — BUPROPION HCL ER (SR) 200 MG PO TB12
400.0000 mg | ORAL_TABLET | Freq: Every morning | ORAL | 0 refills | Status: DC
  Filled 2021-05-27 – 2021-06-13 (×2): qty 60, 30d supply, fill #0

## 2021-06-04 ENCOUNTER — Other Ambulatory Visit (HOSPITAL_COMMUNITY): Payer: Self-pay

## 2021-06-04 MED ORDER — GUANFACINE HCL 1 MG PO TABS
1.0000 mg | ORAL_TABLET | Freq: Every day | ORAL | 0 refills | Status: AC
  Filled 2021-06-04 – 2021-06-13 (×2): qty 30, 30d supply, fill #0

## 2021-06-13 ENCOUNTER — Other Ambulatory Visit (HOSPITAL_COMMUNITY): Payer: Self-pay

## 2021-06-17 ENCOUNTER — Other Ambulatory Visit (HOSPITAL_COMMUNITY): Payer: Self-pay

## 2021-06-17 DIAGNOSIS — F331 Major depressive disorder, recurrent, moderate: Secondary | ICD-10-CM | POA: Diagnosis not present

## 2021-06-17 DIAGNOSIS — R41840 Attention and concentration deficit: Secondary | ICD-10-CM | POA: Diagnosis not present

## 2021-06-17 DIAGNOSIS — F411 Generalized anxiety disorder: Secondary | ICD-10-CM | POA: Diagnosis not present

## 2021-06-17 DIAGNOSIS — F410 Panic disorder [episodic paroxysmal anxiety] without agoraphobia: Secondary | ICD-10-CM | POA: Diagnosis not present

## 2021-06-17 MED ORDER — ALPRAZOLAM 0.25 MG PO TABS
0.7500 mg | ORAL_TABLET | Freq: Every day | ORAL | 0 refills | Status: DC | PRN
  Filled 2021-06-17: qty 120, 40d supply, fill #0

## 2021-06-17 MED ORDER — GABAPENTIN 100 MG PO CAPS
300.0000 mg | ORAL_CAPSULE | Freq: Every day | ORAL | 0 refills | Status: DC | PRN
  Filled 2021-06-17: qty 90, 30d supply, fill #0

## 2021-06-17 MED ORDER — BUPROPION HCL ER (SR) 200 MG PO TB12
400.0000 mg | ORAL_TABLET | Freq: Every morning | ORAL | 1 refills | Status: DC
  Filled 2021-06-17 – 2021-07-23 (×3): qty 60, 30d supply, fill #0
  Filled 2021-07-23: qty 60, 30d supply, fill #1

## 2021-06-17 MED ORDER — DULOXETINE HCL 40 MG PO CPEP
40.0000 mg | ORAL_CAPSULE | ORAL | 2 refills | Status: DC
  Filled 2021-06-17: qty 30, 30d supply, fill #0

## 2021-06-17 MED ORDER — TRAZODONE HCL 50 MG PO TABS
25.0000 mg | ORAL_TABLET | Freq: Every evening | ORAL | 2 refills | Status: DC | PRN
  Filled 2021-06-17: qty 30, 30d supply, fill #0

## 2021-06-17 MED ORDER — GUANFACINE HCL ER 1 MG PO TB24
1.0000 mg | ORAL_TABLET | Freq: Every day | ORAL | 1 refills | Status: AC
  Filled 2021-06-17 – 2021-07-23 (×2): qty 30, 30d supply, fill #0
  Filled 2021-07-23: qty 30, 30d supply, fill #1

## 2021-06-18 ENCOUNTER — Other Ambulatory Visit (HOSPITAL_COMMUNITY): Payer: Self-pay

## 2021-06-18 MED ORDER — DULOXETINE HCL 20 MG PO CPEP
40.0000 mg | ORAL_CAPSULE | Freq: Every morning | ORAL | 2 refills | Status: AC
  Filled 2021-06-18: qty 60, 30d supply, fill #0
  Filled 2021-07-09 – 2021-07-23 (×2): qty 60, 30d supply, fill #1
  Filled 2021-07-23: qty 60, 30d supply, fill #0

## 2021-06-19 ENCOUNTER — Other Ambulatory Visit (HOSPITAL_COMMUNITY): Payer: Self-pay

## 2021-07-09 ENCOUNTER — Other Ambulatory Visit (HOSPITAL_COMMUNITY): Payer: Self-pay

## 2021-07-09 MED FILL — Montelukast Sodium Tab 10 MG (Base Equiv): ORAL | 90 days supply | Qty: 90 | Fill #1 | Status: AC

## 2021-07-10 ENCOUNTER — Other Ambulatory Visit (HOSPITAL_COMMUNITY): Payer: Self-pay

## 2021-07-10 DIAGNOSIS — R41840 Attention and concentration deficit: Secondary | ICD-10-CM | POA: Diagnosis not present

## 2021-07-10 DIAGNOSIS — F411 Generalized anxiety disorder: Secondary | ICD-10-CM | POA: Diagnosis not present

## 2021-07-10 DIAGNOSIS — F410 Panic disorder [episodic paroxysmal anxiety] without agoraphobia: Secondary | ICD-10-CM | POA: Diagnosis not present

## 2021-07-10 DIAGNOSIS — F331 Major depressive disorder, recurrent, moderate: Secondary | ICD-10-CM | POA: Diagnosis not present

## 2021-07-10 MED ORDER — GABAPENTIN 100 MG PO CAPS
300.0000 mg | ORAL_CAPSULE | Freq: Every day | ORAL | 0 refills | Status: DC | PRN
  Filled 2021-07-10 – 2021-07-23 (×3): qty 30, 10d supply, fill #0

## 2021-07-10 MED ORDER — ALPRAZOLAM 0.25 MG PO TABS
0.7500 mg | ORAL_TABLET | Freq: Every day | ORAL | 0 refills | Status: DC | PRN
  Filled 2021-07-10 – 2021-07-23 (×2): qty 90, 30d supply, fill #0

## 2021-07-23 ENCOUNTER — Other Ambulatory Visit (HOSPITAL_COMMUNITY): Payer: Self-pay

## 2021-07-23 ENCOUNTER — Other Ambulatory Visit: Payer: Self-pay

## 2021-07-24 ENCOUNTER — Other Ambulatory Visit (HOSPITAL_COMMUNITY): Payer: Self-pay

## 2021-07-24 MED ORDER — GUANFACINE HCL ER 1 MG PO TB24
ORAL_TABLET | ORAL | 1 refills | Status: AC
  Filled 2021-07-24: qty 30, 30d supply, fill #0

## 2021-07-24 MED ORDER — TRAZODONE HCL 50 MG PO TABS: ORAL_TABLET | ORAL | 2 refills | Status: DC

## 2021-07-24 MED ORDER — DULOXETINE HCL 20 MG PO CPEP
ORAL_CAPSULE | ORAL | 2 refills | Status: AC
  Filled 2021-07-24 – 2021-08-14 (×2): qty 60, 30d supply, fill #0

## 2021-07-24 MED ORDER — GABAPENTIN 100 MG PO CAPS
ORAL_CAPSULE | ORAL | 0 refills | Status: DC
  Filled 2021-07-24: qty 30, 10d supply, fill #0

## 2021-07-24 MED ORDER — BUPROPION HCL ER (SR) 200 MG PO TB12
ORAL_TABLET | ORAL | 1 refills | Status: DC
  Filled 2021-07-24 – 2021-08-14 (×2): qty 60, 30d supply, fill #0

## 2021-07-24 MED ORDER — ALPRAZOLAM 0.25 MG PO TABS
ORAL_TABLET | ORAL | 0 refills | Status: DC
  Filled 2021-07-24: qty 60, 30d supply, fill #0

## 2021-08-13 ENCOUNTER — Other Ambulatory Visit (HOSPITAL_COMMUNITY): Payer: Self-pay

## 2021-08-13 DIAGNOSIS — Z9109 Other allergy status, other than to drugs and biological substances: Secondary | ICD-10-CM | POA: Diagnosis not present

## 2021-08-13 DIAGNOSIS — Z7185 Encounter for immunization safety counseling: Secondary | ICD-10-CM | POA: Diagnosis not present

## 2021-08-13 DIAGNOSIS — J309 Allergic rhinitis, unspecified: Secondary | ICD-10-CM | POA: Diagnosis not present

## 2021-08-13 DIAGNOSIS — T7840XA Allergy, unspecified, initial encounter: Secondary | ICD-10-CM | POA: Diagnosis not present

## 2021-08-13 MED ORDER — AZELASTINE HCL 0.1 % NA SOLN
1.0000 | NASAL | 11 refills | Status: DC
  Filled 2021-08-13 – 2021-09-04 (×2): qty 30, 25d supply, fill #0
  Filled 2021-11-04 – 2021-11-25 (×2): qty 30, 25d supply, fill #1
  Filled 2022-03-02: qty 30, 25d supply, fill #2

## 2021-08-15 ENCOUNTER — Other Ambulatory Visit (HOSPITAL_COMMUNITY): Payer: Self-pay

## 2021-08-16 ENCOUNTER — Other Ambulatory Visit (HOSPITAL_COMMUNITY): Payer: Self-pay

## 2021-08-18 ENCOUNTER — Other Ambulatory Visit (HOSPITAL_COMMUNITY): Payer: Self-pay

## 2021-08-19 ENCOUNTER — Other Ambulatory Visit (HOSPITAL_COMMUNITY): Payer: Self-pay

## 2021-08-19 DIAGNOSIS — F331 Major depressive disorder, recurrent, moderate: Secondary | ICD-10-CM | POA: Diagnosis not present

## 2021-08-19 DIAGNOSIS — R41840 Attention and concentration deficit: Secondary | ICD-10-CM | POA: Diagnosis not present

## 2021-08-19 DIAGNOSIS — F410 Panic disorder [episodic paroxysmal anxiety] without agoraphobia: Secondary | ICD-10-CM | POA: Diagnosis not present

## 2021-08-19 DIAGNOSIS — F411 Generalized anxiety disorder: Secondary | ICD-10-CM | POA: Diagnosis not present

## 2021-08-19 MED ORDER — DULOXETINE HCL 20 MG PO CPEP
ORAL_CAPSULE | ORAL | 2 refills | Status: AC
  Filled 2021-09-21: qty 60, 30d supply, fill #0

## 2021-08-19 MED ORDER — TRAZODONE HCL 50 MG PO TABS: ORAL_TABLET | ORAL | 2 refills | Status: AC

## 2021-08-19 MED ORDER — GUANFACINE HCL ER 2 MG PO TB24
ORAL_TABLET | ORAL | 1 refills | Status: AC
  Filled 2021-08-19: qty 30, 30d supply, fill #0

## 2021-08-19 MED ORDER — ALPRAZOLAM 0.25 MG PO TABS
ORAL_TABLET | ORAL | 0 refills | Status: DC
  Filled 2021-08-21: qty 60, 30d supply, fill #0

## 2021-08-19 MED ORDER — BUPROPION HCL ER (SR) 200 MG PO TB12
ORAL_TABLET | ORAL | 1 refills | Status: DC
  Filled 2021-08-19 – 2021-09-21 (×2): qty 60, 30d supply, fill #0

## 2021-08-21 ENCOUNTER — Other Ambulatory Visit (HOSPITAL_COMMUNITY): Payer: Self-pay

## 2021-08-22 ENCOUNTER — Other Ambulatory Visit (HOSPITAL_COMMUNITY): Payer: Self-pay

## 2021-09-04 ENCOUNTER — Other Ambulatory Visit (HOSPITAL_COMMUNITY): Payer: Self-pay

## 2021-09-17 ENCOUNTER — Other Ambulatory Visit (HOSPITAL_COMMUNITY): Payer: Self-pay

## 2021-09-17 MED ORDER — CARESTART COVID-19 HOME TEST VI KIT
PACK | 0 refills | Status: AC
  Filled 2021-09-17: qty 2, 2d supply, fill #0

## 2021-09-22 ENCOUNTER — Other Ambulatory Visit (HOSPITAL_COMMUNITY): Payer: Self-pay

## 2021-09-23 ENCOUNTER — Other Ambulatory Visit (HOSPITAL_COMMUNITY): Payer: Self-pay

## 2021-09-23 DIAGNOSIS — F411 Generalized anxiety disorder: Secondary | ICD-10-CM | POA: Diagnosis not present

## 2021-09-23 DIAGNOSIS — F410 Panic disorder [episodic paroxysmal anxiety] without agoraphobia: Secondary | ICD-10-CM | POA: Diagnosis not present

## 2021-09-23 DIAGNOSIS — R41840 Attention and concentration deficit: Secondary | ICD-10-CM | POA: Diagnosis not present

## 2021-09-23 DIAGNOSIS — F331 Major depressive disorder, recurrent, moderate: Secondary | ICD-10-CM | POA: Diagnosis not present

## 2021-09-23 MED ORDER — GABAPENTIN 100 MG PO CAPS
ORAL_CAPSULE | ORAL | 0 refills | Status: DC
  Filled 2021-09-23: qty 30, 10d supply, fill #0

## 2021-09-23 MED ORDER — DULOXETINE HCL 20 MG PO CPEP
ORAL_CAPSULE | ORAL | 2 refills | Status: AC
  Filled 2021-09-23 – 2021-10-17 (×2): qty 60, 30d supply, fill #0

## 2021-09-23 MED ORDER — BUPROPION HCL ER (SR) 200 MG PO TB12
ORAL_TABLET | ORAL | 1 refills | Status: DC
Start: 2021-09-23 — End: 2022-08-12
  Filled 2021-09-23 – 2021-10-17 (×3): qty 60, 30d supply, fill #0

## 2021-09-23 MED ORDER — GUANFACINE HCL ER 2 MG PO TB24
ORAL_TABLET | ORAL | 1 refills | Status: AC
  Filled 2021-09-23: qty 30, 30d supply, fill #0

## 2021-09-23 MED ORDER — TRAZODONE HCL 50 MG PO TABS
ORAL_TABLET | ORAL | 2 refills | Status: AC
  Filled 2021-09-23: qty 30, 30d supply, fill #0

## 2021-09-25 ENCOUNTER — Other Ambulatory Visit (HOSPITAL_COMMUNITY): Payer: Self-pay

## 2021-09-26 ENCOUNTER — Other Ambulatory Visit (HOSPITAL_COMMUNITY): Payer: Self-pay

## 2021-09-27 ENCOUNTER — Other Ambulatory Visit (HOSPITAL_COMMUNITY): Payer: Self-pay

## 2021-09-29 ENCOUNTER — Other Ambulatory Visit (HOSPITAL_COMMUNITY): Payer: Self-pay

## 2021-10-07 ENCOUNTER — Other Ambulatory Visit (HOSPITAL_COMMUNITY): Payer: Self-pay

## 2021-10-08 ENCOUNTER — Other Ambulatory Visit (HOSPITAL_COMMUNITY): Payer: Self-pay

## 2021-10-15 ENCOUNTER — Other Ambulatory Visit (HOSPITAL_COMMUNITY): Payer: Self-pay

## 2021-10-15 MED ORDER — ALPRAZOLAM 0.25 MG PO TABS
0.5000 mg | ORAL_TABLET | Freq: Every day | ORAL | 0 refills | Status: DC | PRN
  Filled 2021-10-15: qty 60, 30d supply, fill #0

## 2021-10-16 ENCOUNTER — Other Ambulatory Visit (HOSPITAL_COMMUNITY): Payer: Self-pay

## 2021-10-17 ENCOUNTER — Other Ambulatory Visit (HOSPITAL_COMMUNITY): Payer: Self-pay

## 2021-10-21 ENCOUNTER — Other Ambulatory Visit (HOSPITAL_COMMUNITY): Payer: Self-pay

## 2021-10-21 DIAGNOSIS — F410 Panic disorder [episodic paroxysmal anxiety] without agoraphobia: Secondary | ICD-10-CM | POA: Diagnosis not present

## 2021-10-21 DIAGNOSIS — F411 Generalized anxiety disorder: Secondary | ICD-10-CM | POA: Diagnosis not present

## 2021-10-21 DIAGNOSIS — R41840 Attention and concentration deficit: Secondary | ICD-10-CM | POA: Diagnosis not present

## 2021-10-21 DIAGNOSIS — F331 Major depressive disorder, recurrent, moderate: Secondary | ICD-10-CM | POA: Diagnosis not present

## 2021-10-21 MED ORDER — BUPROPION HCL ER (SR) 200 MG PO TB12
ORAL_TABLET | ORAL | 1 refills | Status: DC
Start: 2021-10-21 — End: 2022-08-12
  Filled 2021-10-21: qty 60, 30d supply, fill #0

## 2021-10-21 MED ORDER — DULOXETINE HCL 20 MG PO CPEP
ORAL_CAPSULE | ORAL | 2 refills | Status: AC
  Filled 2021-10-21 – 2021-10-27 (×2): qty 90, 30d supply, fill #0
  Filled 2021-12-17: qty 90, 30d supply, fill #1

## 2021-10-21 MED ORDER — TRAZODONE HCL 50 MG PO TABS
ORAL_TABLET | ORAL | 2 refills | Status: AC
  Filled 2021-10-21: qty 30, 30d supply, fill #0

## 2021-10-21 MED ORDER — GUANFACINE HCL ER 2 MG PO TB24
ORAL_TABLET | ORAL | 1 refills | Status: AC
  Filled 2021-10-21: qty 30, 30d supply, fill #0

## 2021-10-21 MED ORDER — GABAPENTIN 100 MG PO CAPS
ORAL_CAPSULE | ORAL | 0 refills | Status: DC
Start: 2021-10-21 — End: 2022-08-12
  Filled 2021-10-21: qty 30, 10d supply, fill #0

## 2021-10-27 ENCOUNTER — Other Ambulatory Visit (HOSPITAL_COMMUNITY): Payer: Self-pay

## 2021-10-28 ENCOUNTER — Other Ambulatory Visit (HOSPITAL_COMMUNITY): Payer: Self-pay

## 2021-10-30 ENCOUNTER — Other Ambulatory Visit (HOSPITAL_COMMUNITY): Payer: Self-pay

## 2021-11-03 ENCOUNTER — Other Ambulatory Visit (HOSPITAL_COMMUNITY): Payer: Self-pay

## 2021-11-04 ENCOUNTER — Other Ambulatory Visit: Payer: Self-pay

## 2021-11-04 ENCOUNTER — Other Ambulatory Visit (HOSPITAL_COMMUNITY): Payer: Self-pay

## 2021-11-04 DIAGNOSIS — F331 Major depressive disorder, recurrent, moderate: Secondary | ICD-10-CM | POA: Diagnosis not present

## 2021-11-04 DIAGNOSIS — R41840 Attention and concentration deficit: Secondary | ICD-10-CM | POA: Diagnosis not present

## 2021-11-04 DIAGNOSIS — F410 Panic disorder [episodic paroxysmal anxiety] without agoraphobia: Secondary | ICD-10-CM | POA: Diagnosis not present

## 2021-11-04 DIAGNOSIS — F411 Generalized anxiety disorder: Secondary | ICD-10-CM | POA: Diagnosis not present

## 2021-11-04 MED ORDER — BUPROPION HCL ER (SR) 200 MG PO TB12
ORAL_TABLET | ORAL | 1 refills | Status: DC
  Filled 2021-11-04: qty 60, 30d supply, fill #0

## 2021-11-04 MED ORDER — GUANFACINE HCL ER 2 MG PO TB24
ORAL_TABLET | ORAL | 1 refills | Status: DC
  Filled 2021-11-05: qty 30, fill #0

## 2021-11-04 MED ORDER — DULOXETINE HCL 40 MG PO CPEP: ORAL_CAPSULE | ORAL | 2 refills | Status: DC

## 2021-11-04 MED ORDER — TRAZODONE HCL 50 MG PO TABS
ORAL_TABLET | ORAL | 2 refills | Status: AC
  Filled 2021-11-04: qty 30, 30d supply, fill #0

## 2021-11-04 MED ORDER — GUANFACINE HCL ER 2 MG PO TB24
ORAL_TABLET | ORAL | 1 refills | Status: AC
  Filled 2021-11-04: qty 30, 30d supply, fill #0

## 2021-11-04 MED ORDER — TRAZODONE HCL 50 MG PO TABS
ORAL_TABLET | ORAL | 2 refills | Status: DC
  Filled ????-??-??: fill #0

## 2021-11-04 MED ORDER — ALPRAZOLAM 0.25 MG PO TABS
ORAL_TABLET | ORAL | 0 refills | Status: DC
  Filled 2021-11-04 – 2021-11-14 (×2): qty 60, 30d supply, fill #0

## 2021-11-04 MED ORDER — DULOXETINE HCL 40 MG PO CPEP
ORAL_CAPSULE | ORAL | 2 refills | Status: DC
  Filled 2021-11-04: qty 60, 30d supply, fill #0

## 2021-11-04 MED FILL — Montelukast Sodium Tab 10 MG (Base Equiv): ORAL | 90 days supply | Qty: 90 | Fill #2 | Status: CN

## 2021-11-05 ENCOUNTER — Other Ambulatory Visit (HOSPITAL_COMMUNITY): Payer: Self-pay

## 2021-11-12 ENCOUNTER — Other Ambulatory Visit (HOSPITAL_COMMUNITY): Payer: Self-pay

## 2021-11-14 ENCOUNTER — Other Ambulatory Visit (HOSPITAL_COMMUNITY): Payer: Self-pay

## 2021-11-18 ENCOUNTER — Other Ambulatory Visit (HOSPITAL_COMMUNITY): Payer: Self-pay

## 2021-11-18 DIAGNOSIS — F411 Generalized anxiety disorder: Secondary | ICD-10-CM | POA: Diagnosis not present

## 2021-11-18 DIAGNOSIS — F410 Panic disorder [episodic paroxysmal anxiety] without agoraphobia: Secondary | ICD-10-CM | POA: Diagnosis not present

## 2021-11-18 DIAGNOSIS — F331 Major depressive disorder, recurrent, moderate: Secondary | ICD-10-CM | POA: Diagnosis not present

## 2021-11-18 DIAGNOSIS — R41840 Attention and concentration deficit: Secondary | ICD-10-CM | POA: Diagnosis not present

## 2021-11-18 MED ORDER — BUPROPION HCL ER (SR) 200 MG PO TB12
400.0000 mg | ORAL_TABLET | Freq: Every morning | ORAL | 1 refills | Status: DC
  Filled 2021-11-18: qty 60, 30d supply, fill #0

## 2021-11-18 MED ORDER — GABAPENTIN 100 MG PO CAPS
300.0000 mg | ORAL_CAPSULE | Freq: Every day | ORAL | 0 refills | Status: DC | PRN
  Filled 2021-11-18: qty 30, 10d supply, fill #0

## 2021-11-18 MED ORDER — DULOXETINE HCL 40 MG PO CPEP
80.0000 mg | ORAL_CAPSULE | Freq: Every morning | ORAL | 2 refills | Status: DC
  Filled 2021-11-18: qty 60, 30d supply, fill #0

## 2021-11-18 MED ORDER — GUANFACINE HCL ER 2 MG PO TB24
2.0000 mg | ORAL_TABLET | Freq: Every evening | ORAL | 1 refills | Status: AC
  Filled 2021-11-18: qty 30, 30d supply, fill #0

## 2021-11-18 MED ORDER — TRAZODONE HCL 50 MG PO TABS
25.0000 mg | ORAL_TABLET | Freq: Every evening | ORAL | 2 refills | Status: AC | PRN
  Filled 2021-11-18: qty 30, 30d supply, fill #0

## 2021-11-19 ENCOUNTER — Other Ambulatory Visit (HOSPITAL_COMMUNITY): Payer: Self-pay

## 2021-11-22 ENCOUNTER — Other Ambulatory Visit (HOSPITAL_COMMUNITY): Payer: Self-pay

## 2021-11-25 ENCOUNTER — Other Ambulatory Visit (HOSPITAL_COMMUNITY): Payer: Self-pay

## 2021-11-25 DIAGNOSIS — F900 Attention-deficit hyperactivity disorder, predominantly inattentive type: Secondary | ICD-10-CM | POA: Diagnosis not present

## 2021-11-25 DIAGNOSIS — F410 Panic disorder [episodic paroxysmal anxiety] without agoraphobia: Secondary | ICD-10-CM | POA: Diagnosis not present

## 2021-11-25 DIAGNOSIS — F331 Major depressive disorder, recurrent, moderate: Secondary | ICD-10-CM | POA: Diagnosis not present

## 2021-11-25 DIAGNOSIS — F4310 Post-traumatic stress disorder, unspecified: Secondary | ICD-10-CM | POA: Diagnosis not present

## 2021-11-25 MED FILL — Montelukast Sodium Tab 10 MG (Base Equiv): ORAL | 90 days supply | Qty: 90 | Fill #2 | Status: AC

## 2021-11-26 ENCOUNTER — Other Ambulatory Visit (HOSPITAL_COMMUNITY): Payer: Self-pay

## 2021-11-29 ENCOUNTER — Other Ambulatory Visit (HOSPITAL_COMMUNITY): Payer: Self-pay

## 2021-12-04 DIAGNOSIS — H1033 Unspecified acute conjunctivitis, bilateral: Secondary | ICD-10-CM | POA: Diagnosis not present

## 2021-12-05 ENCOUNTER — Other Ambulatory Visit (HOSPITAL_COMMUNITY): Payer: Self-pay

## 2021-12-05 DIAGNOSIS — F4320 Adjustment disorder, unspecified: Secondary | ICD-10-CM | POA: Diagnosis not present

## 2021-12-11 ENCOUNTER — Other Ambulatory Visit (HOSPITAL_COMMUNITY): Payer: Self-pay

## 2021-12-12 DIAGNOSIS — F4320 Adjustment disorder, unspecified: Secondary | ICD-10-CM | POA: Diagnosis not present

## 2021-12-17 ENCOUNTER — Other Ambulatory Visit (HOSPITAL_COMMUNITY): Payer: Self-pay

## 2021-12-19 DIAGNOSIS — F4320 Adjustment disorder, unspecified: Secondary | ICD-10-CM | POA: Diagnosis not present

## 2021-12-26 ENCOUNTER — Other Ambulatory Visit (HOSPITAL_COMMUNITY): Payer: Self-pay

## 2022-01-06 DIAGNOSIS — F410 Panic disorder [episodic paroxysmal anxiety] without agoraphobia: Secondary | ICD-10-CM | POA: Diagnosis not present

## 2022-01-06 DIAGNOSIS — F5101 Primary insomnia: Secondary | ICD-10-CM | POA: Diagnosis not present

## 2022-01-06 DIAGNOSIS — F900 Attention-deficit hyperactivity disorder, predominantly inattentive type: Secondary | ICD-10-CM | POA: Diagnosis not present

## 2022-01-06 DIAGNOSIS — F3342 Major depressive disorder, recurrent, in full remission: Secondary | ICD-10-CM | POA: Diagnosis not present

## 2022-01-07 ENCOUNTER — Other Ambulatory Visit (HOSPITAL_COMMUNITY): Payer: Self-pay

## 2022-01-07 MED ORDER — LORAZEPAM 1 MG PO TABS
1.0000 mg | ORAL_TABLET | Freq: Every day | ORAL | 2 refills | Status: DC | PRN
  Filled 2022-01-07: qty 30, 30d supply, fill #0
  Filled 2022-02-02: qty 30, 30d supply, fill #1
  Filled 2022-03-02: qty 30, 30d supply, fill #2

## 2022-01-07 MED ORDER — AMPHETAMINE-DEXTROAMPHET ER 20 MG PO CP24
ORAL_CAPSULE | ORAL | 0 refills | Status: DC
  Filled 2022-01-07: qty 30, 30d supply, fill #0

## 2022-01-07 MED ORDER — TRINTELLIX 20 MG PO TABS
ORAL_TABLET | Freq: Every day | ORAL | 12 refills | Status: DC
Start: 2022-01-07 — End: 2023-01-22
  Filled 2022-01-07: qty 30, 30d supply, fill #0
  Filled 2022-02-02: qty 30, 30d supply, fill #1
  Filled 2022-03-02: qty 30, 30d supply, fill #2
  Filled 2022-04-06: qty 30, 30d supply, fill #3
  Filled 2022-05-11: qty 30, 30d supply, fill #4
  Filled 2022-06-09: qty 30, 30d supply, fill #5
  Filled 2022-07-03: qty 30, 30d supply, fill #6
  Filled 2022-08-10: qty 30, 30d supply, fill #7
  Filled 2022-08-11 – 2022-09-12 (×2): qty 30, 30d supply, fill #8
  Filled 2022-10-10: qty 30, 30d supply, fill #9
  Filled 2022-10-27 – 2022-11-12 (×2): qty 30, 30d supply, fill #10
  Filled 2022-11-30 – 2022-12-07 (×2): qty 30, 30d supply, fill #11

## 2022-02-02 ENCOUNTER — Other Ambulatory Visit (HOSPITAL_COMMUNITY): Payer: Self-pay

## 2022-02-04 ENCOUNTER — Other Ambulatory Visit (HOSPITAL_COMMUNITY): Payer: Self-pay

## 2022-02-04 MED ORDER — AMPHETAMINE-DEXTROAMPHET ER 20 MG PO CP24: ORAL_CAPSULE | ORAL | 0 refills | Status: DC

## 2022-02-04 MED ORDER — AMPHETAMINE-DEXTROAMPHETAMINE 10 MG PO TABS
ORAL_TABLET | ORAL | 0 refills | Status: AC
  Filled 2022-02-06: qty 60, 30d supply, fill #0

## 2022-02-05 DIAGNOSIS — F411 Generalized anxiety disorder: Secondary | ICD-10-CM | POA: Diagnosis not present

## 2022-02-06 ENCOUNTER — Other Ambulatory Visit (HOSPITAL_COMMUNITY): Payer: Self-pay

## 2022-02-09 ENCOUNTER — Other Ambulatory Visit (HOSPITAL_COMMUNITY): Payer: Self-pay

## 2022-02-09 DIAGNOSIS — R232 Flushing: Secondary | ICD-10-CM | POA: Diagnosis not present

## 2022-02-09 DIAGNOSIS — N951 Menopausal and female climacteric states: Secondary | ICD-10-CM | POA: Diagnosis not present

## 2022-02-27 DIAGNOSIS — F411 Generalized anxiety disorder: Secondary | ICD-10-CM | POA: Diagnosis not present

## 2022-02-27 DIAGNOSIS — N959 Unspecified menopausal and perimenopausal disorder: Secondary | ICD-10-CM | POA: Diagnosis not present

## 2022-02-27 DIAGNOSIS — F4320 Adjustment disorder, unspecified: Secondary | ICD-10-CM | POA: Diagnosis not present

## 2022-02-27 DIAGNOSIS — Z0000 Encounter for general adult medical examination without abnormal findings: Secondary | ICD-10-CM | POA: Diagnosis not present

## 2022-03-02 ENCOUNTER — Other Ambulatory Visit (HOSPITAL_COMMUNITY): Payer: Self-pay

## 2022-03-02 MED ORDER — AMPHETAMINE-DEXTROAMPHETAMINE 10 MG PO TABS
ORAL_TABLET | ORAL | 0 refills | Status: DC
  Filled 2022-03-09: qty 60, 30d supply, fill #0

## 2022-03-03 ENCOUNTER — Other Ambulatory Visit (HOSPITAL_COMMUNITY): Payer: Self-pay

## 2022-03-03 DIAGNOSIS — Z0000 Encounter for general adult medical examination without abnormal findings: Secondary | ICD-10-CM | POA: Diagnosis not present

## 2022-03-03 DIAGNOSIS — M7751 Other enthesopathy of right foot: Secondary | ICD-10-CM | POA: Diagnosis not present

## 2022-03-03 DIAGNOSIS — Z23 Encounter for immunization: Secondary | ICD-10-CM | POA: Diagnosis not present

## 2022-03-03 MED ORDER — DICLOFENAC SODIUM 1 % EX GEL
4.0000 g | Freq: Four times a day (QID) | CUTANEOUS | 11 refills | Status: AC
  Filled 2022-03-03: qty 500, 30d supply, fill #0

## 2022-03-05 ENCOUNTER — Other Ambulatory Visit (HOSPITAL_COMMUNITY): Payer: Self-pay

## 2022-03-06 ENCOUNTER — Other Ambulatory Visit (HOSPITAL_COMMUNITY): Payer: Self-pay

## 2022-03-06 DIAGNOSIS — F331 Major depressive disorder, recurrent, moderate: Secondary | ICD-10-CM | POA: Diagnosis not present

## 2022-03-06 DIAGNOSIS — F411 Generalized anxiety disorder: Secondary | ICD-10-CM | POA: Diagnosis not present

## 2022-03-06 DIAGNOSIS — F3281 Premenstrual dysphoric disorder: Secondary | ICD-10-CM | POA: Diagnosis not present

## 2022-03-06 DIAGNOSIS — N951 Menopausal and female climacteric states: Secondary | ICD-10-CM | POA: Diagnosis not present

## 2022-03-06 MED ORDER — NORETHINDRONE ACET-ETHINYL EST 1-20 MG-MCG PO TABS
ORAL_TABLET | ORAL | 3 refills | Status: AC
  Filled 2022-03-06: qty 63, 63d supply, fill #0
  Filled 2022-05-27: qty 63, 84d supply, fill #1

## 2022-03-10 ENCOUNTER — Other Ambulatory Visit (HOSPITAL_COMMUNITY): Payer: Self-pay

## 2022-03-13 DIAGNOSIS — F411 Generalized anxiety disorder: Secondary | ICD-10-CM | POA: Diagnosis not present

## 2022-03-13 DIAGNOSIS — F4320 Adjustment disorder, unspecified: Secondary | ICD-10-CM | POA: Diagnosis not present

## 2022-03-18 ENCOUNTER — Other Ambulatory Visit (HOSPITAL_BASED_OUTPATIENT_CLINIC_OR_DEPARTMENT_OTHER): Payer: Self-pay

## 2022-03-23 ENCOUNTER — Other Ambulatory Visit (HOSPITAL_BASED_OUTPATIENT_CLINIC_OR_DEPARTMENT_OTHER): Payer: Self-pay

## 2022-03-23 DIAGNOSIS — J309 Allergic rhinitis, unspecified: Secondary | ICD-10-CM | POA: Diagnosis not present

## 2022-03-23 MED ORDER — AZELASTINE HCL 0.1 % NA SOLN
NASAL | 11 refills | Status: DC
  Filled 2022-03-23: qty 30, 25d supply, fill #0
  Filled 2022-05-12: qty 30, 25d supply, fill #1
  Filled 2022-07-21: qty 30, 25d supply, fill #2
  Filled 2022-08-10: qty 30, 25d supply, fill #3
  Filled 2022-08-11: qty 30, 25d supply, fill #4

## 2022-03-23 MED ORDER — MONTELUKAST SODIUM 10 MG PO TABS
10.0000 mg | ORAL_TABLET | Freq: Every day | ORAL | 3 refills | Status: DC
  Filled 2022-03-23: qty 30, 30d supply, fill #0
  Filled 2022-04-20: qty 30, 30d supply, fill #1
  Filled 2022-05-12: qty 30, 30d supply, fill #2
  Filled 2022-06-09: qty 30, 30d supply, fill #3

## 2022-03-27 DIAGNOSIS — F4320 Adjustment disorder, unspecified: Secondary | ICD-10-CM | POA: Diagnosis not present

## 2022-03-27 DIAGNOSIS — F411 Generalized anxiety disorder: Secondary | ICD-10-CM | POA: Diagnosis not present

## 2022-03-31 DIAGNOSIS — Z23 Encounter for immunization: Secondary | ICD-10-CM | POA: Diagnosis not present

## 2022-04-06 ENCOUNTER — Other Ambulatory Visit (HOSPITAL_COMMUNITY): Payer: Self-pay

## 2022-04-07 ENCOUNTER — Other Ambulatory Visit (HOSPITAL_COMMUNITY): Payer: Self-pay

## 2022-04-07 DIAGNOSIS — F900 Attention-deficit hyperactivity disorder, predominantly inattentive type: Secondary | ICD-10-CM | POA: Diagnosis not present

## 2022-04-07 DIAGNOSIS — F4310 Post-traumatic stress disorder, unspecified: Secondary | ICD-10-CM | POA: Diagnosis not present

## 2022-04-07 DIAGNOSIS — F3342 Major depressive disorder, recurrent, in full remission: Secondary | ICD-10-CM | POA: Diagnosis not present

## 2022-04-08 ENCOUNTER — Other Ambulatory Visit (HOSPITAL_COMMUNITY): Payer: Self-pay

## 2022-04-08 MED ORDER — LORAZEPAM 1 MG PO TABS
ORAL_TABLET | ORAL | 2 refills | Status: DC
  Filled 2022-04-08: qty 30, 30d supply, fill #0
  Filled 2022-05-04 – 2022-05-05 (×2): qty 30, 30d supply, fill #1
  Filled 2022-06-09: qty 30, 30d supply, fill #2

## 2022-04-08 MED ORDER — AMPHETAMINE-DEXTROAMPHETAMINE 10 MG PO TABS
ORAL_TABLET | Freq: Two times a day (BID) | ORAL | 0 refills | Status: DC
  Filled 2022-04-08: qty 60, 30d supply, fill #0

## 2022-04-20 ENCOUNTER — Other Ambulatory Visit (HOSPITAL_COMMUNITY): Payer: Self-pay

## 2022-04-28 DIAGNOSIS — F4320 Adjustment disorder, unspecified: Secondary | ICD-10-CM | POA: Diagnosis not present

## 2022-04-28 DIAGNOSIS — F411 Generalized anxiety disorder: Secondary | ICD-10-CM | POA: Diagnosis not present

## 2022-05-04 ENCOUNTER — Other Ambulatory Visit (HOSPITAL_COMMUNITY): Payer: Self-pay

## 2022-05-04 MED ORDER — AMPHETAMINE-DEXTROAMPHETAMINE 10 MG PO TABS
10.0000 mg | ORAL_TABLET | Freq: Two times a day (BID) | ORAL | 0 refills | Status: DC
  Filled 2022-05-12: qty 60, 30d supply, fill #0

## 2022-05-05 ENCOUNTER — Other Ambulatory Visit (HOSPITAL_COMMUNITY): Payer: Self-pay

## 2022-05-11 ENCOUNTER — Other Ambulatory Visit (HOSPITAL_COMMUNITY): Payer: Self-pay

## 2022-05-12 ENCOUNTER — Other Ambulatory Visit (HOSPITAL_COMMUNITY): Payer: Self-pay

## 2022-05-14 ENCOUNTER — Other Ambulatory Visit (HOSPITAL_COMMUNITY): Payer: Self-pay

## 2022-05-20 DIAGNOSIS — F4320 Adjustment disorder, unspecified: Secondary | ICD-10-CM | POA: Diagnosis not present

## 2022-05-20 DIAGNOSIS — F411 Generalized anxiety disorder: Secondary | ICD-10-CM | POA: Diagnosis not present

## 2022-05-26 ENCOUNTER — Other Ambulatory Visit (HOSPITAL_COMMUNITY): Payer: Self-pay

## 2022-05-27 ENCOUNTER — Other Ambulatory Visit (HOSPITAL_COMMUNITY): Payer: Self-pay

## 2022-06-09 ENCOUNTER — Other Ambulatory Visit (HOSPITAL_COMMUNITY): Payer: Self-pay

## 2022-06-09 DIAGNOSIS — F4320 Adjustment disorder, unspecified: Secondary | ICD-10-CM | POA: Diagnosis not present

## 2022-06-09 DIAGNOSIS — F411 Generalized anxiety disorder: Secondary | ICD-10-CM | POA: Diagnosis not present

## 2022-06-10 ENCOUNTER — Other Ambulatory Visit (HOSPITAL_COMMUNITY): Payer: Self-pay

## 2022-06-10 MED ORDER — AMPHETAMINE-DEXTROAMPHETAMINE 10 MG PO TABS
ORAL_TABLET | ORAL | 0 refills | Status: AC
  Filled 2022-06-13: qty 60, 30d supply, fill #0

## 2022-06-13 ENCOUNTER — Other Ambulatory Visit (HOSPITAL_COMMUNITY): Payer: Self-pay

## 2022-06-21 IMAGING — MG MM DIGITAL DIAGNOSTIC UNILAT*R* W/ TOMO W/ CAD
6 series · 6 of 18 positions shown · non-contrast
Comparison: Previous exam(s).

CLINICAL DATA: 39-year-old female presenting for screening recall
for a possible right breast asymmetry.

EXAM:
DIGITAL DIAGNOSTIC UNILATERAL RIGHT MAMMOGRAM WITH TOMOSYNTHESIS AND
CAD
TECHNIQUE: Right digital diagnostic mammography and breast tomosynthesis was
performed. The images were evaluated with computer-aided detection.

[R CC synth-2D (1 of 2)]
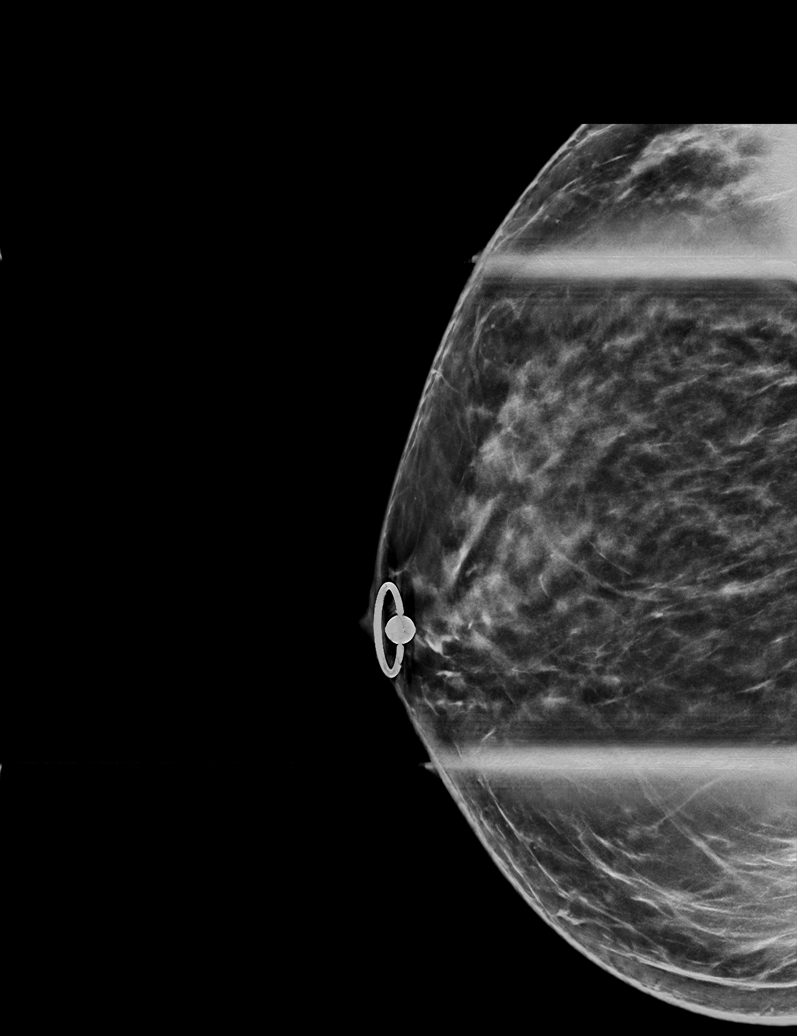

[R ML synth-2D]
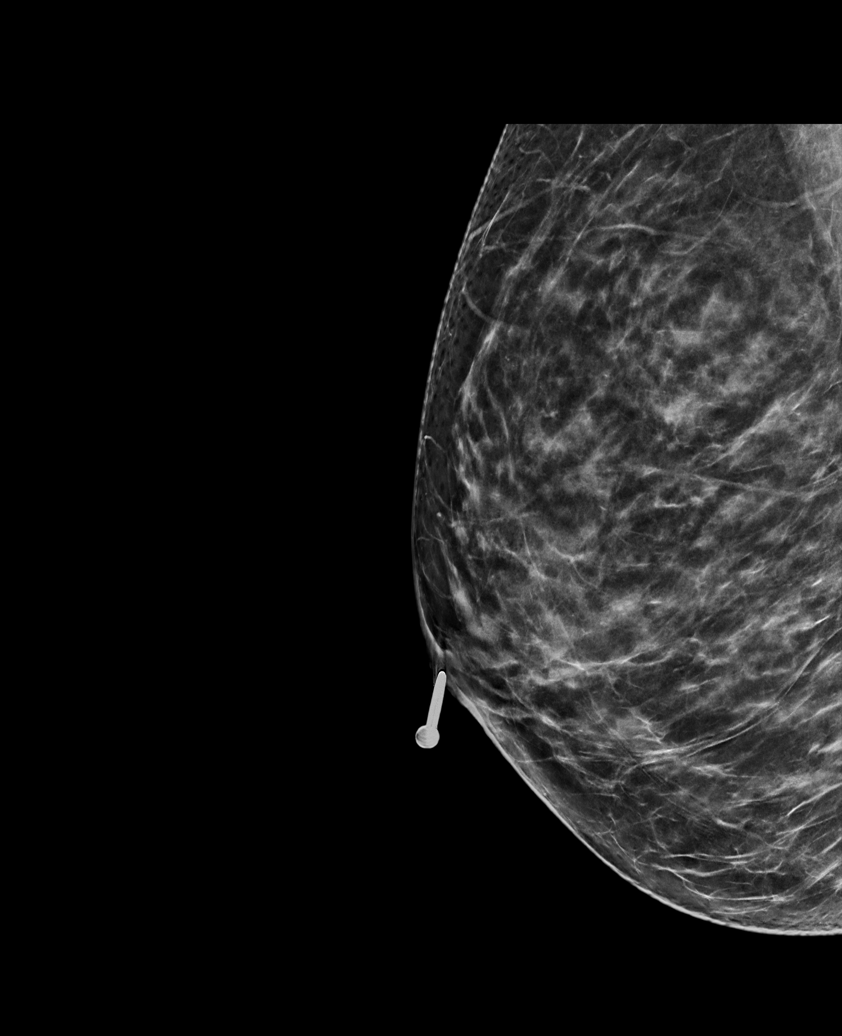

[R CC synth-2D (2 of 2)]
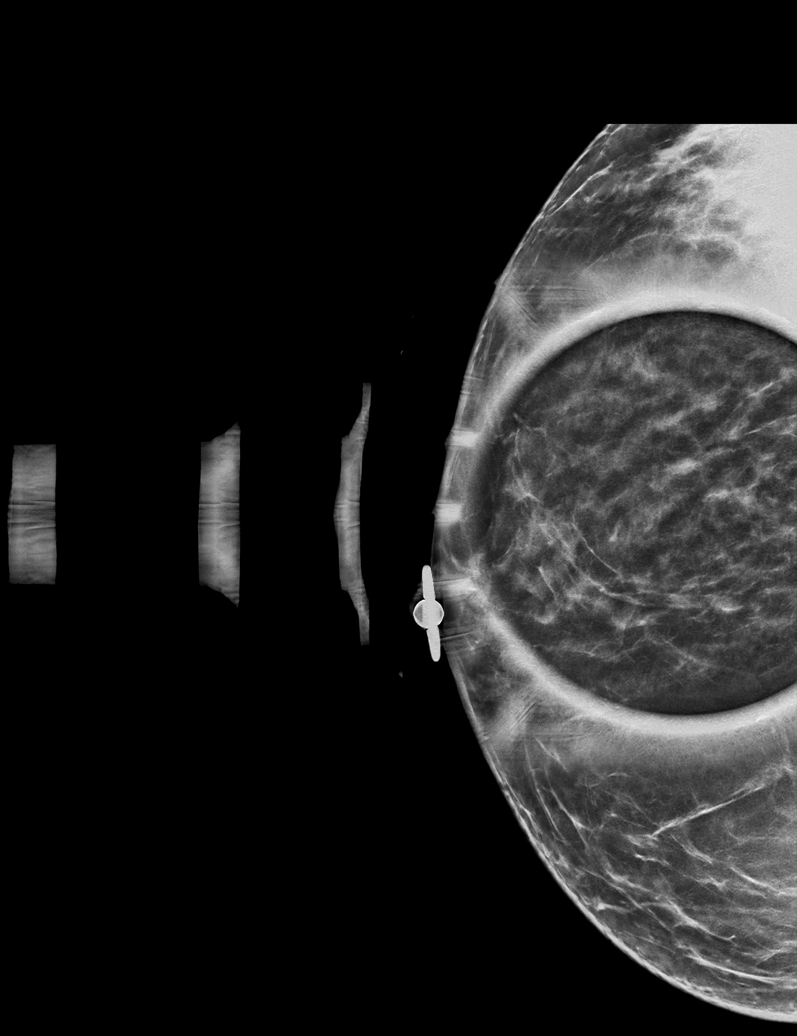

[R CC tomo (1 of 2) · tomo slice 32/63.0]
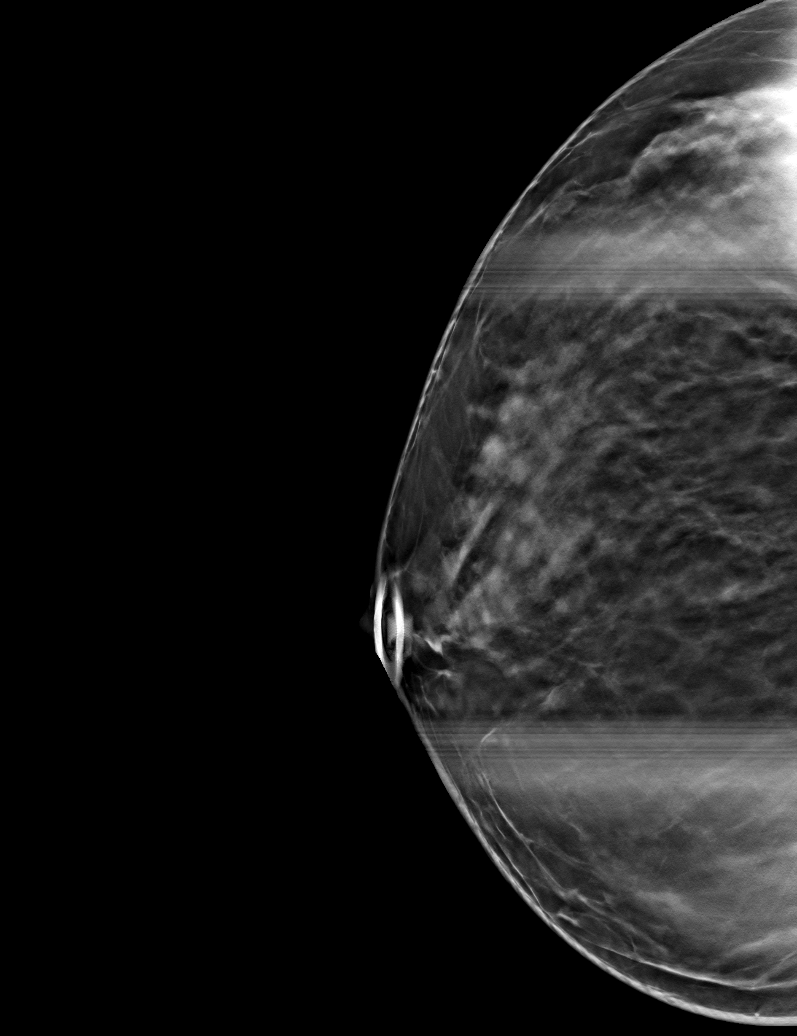

[R ML tomo · tomo slice 37/74.0]
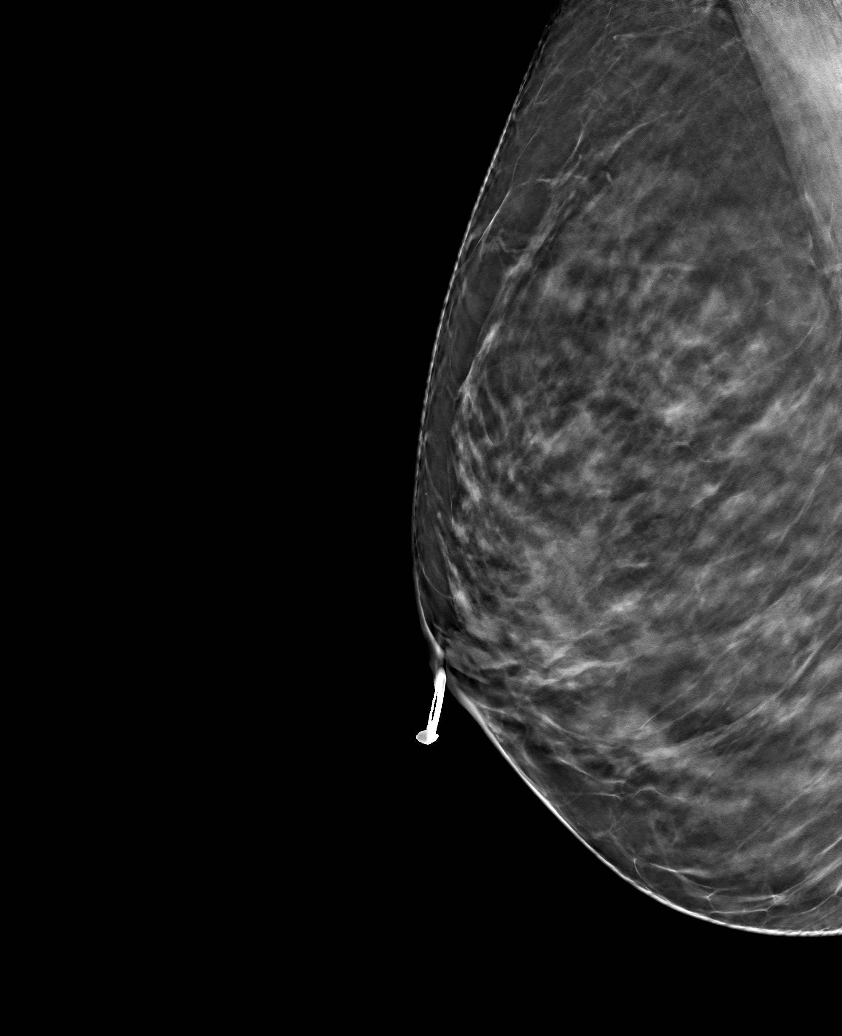

[R CC tomo (2 of 2) · tomo slice 29/56.0]
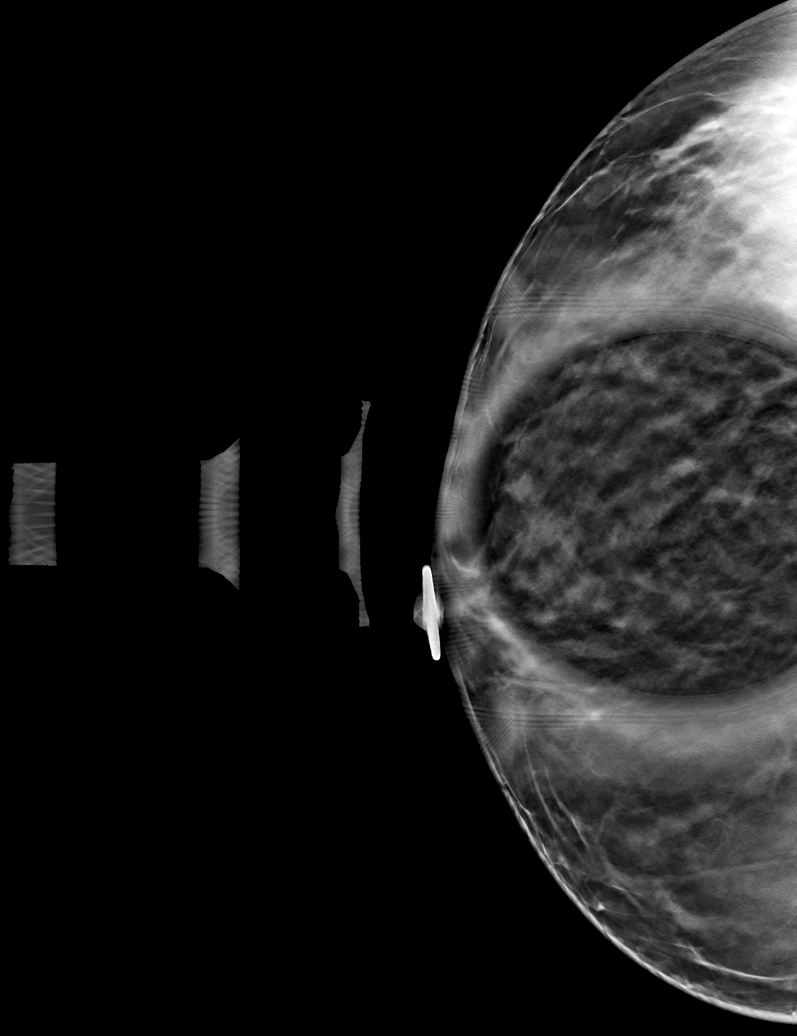

[6 of 18 positions shown; findings below may reference images not displayed]

ACR Breast Density Category c: The breast tissue is heterogeneously
dense, which may obscure small masses.
FINDINGS: The asymmetry of concern in the anterior right breast resolves on
spot compression tomosynthesis imaging. No suspicious
calcifications, masses or areas of distortion are seen in the right
breast.
IMPRESSION: Resolution of the right breast asymmetry consistent with overlapping
fibroglandular tissue.

RECOMMENDATION:
Screening mammogram in one year.(Code:6M-[id number])

I have discussed the findings and recommendations with the patient.
If applicable, a reminder letter will be sent to the patient
regarding the next appointment.

BI-RADS CATEGORY  1: Negative.

## 2022-06-24 DIAGNOSIS — F411 Generalized anxiety disorder: Secondary | ICD-10-CM | POA: Diagnosis not present

## 2022-06-24 DIAGNOSIS — F4320 Adjustment disorder, unspecified: Secondary | ICD-10-CM | POA: Diagnosis not present

## 2022-07-03 ENCOUNTER — Other Ambulatory Visit (HOSPITAL_COMMUNITY): Payer: Self-pay

## 2022-07-08 ENCOUNTER — Other Ambulatory Visit (HOSPITAL_COMMUNITY): Payer: Self-pay

## 2022-07-09 ENCOUNTER — Other Ambulatory Visit (HOSPITAL_COMMUNITY): Payer: Self-pay

## 2022-07-09 DIAGNOSIS — F411 Generalized anxiety disorder: Secondary | ICD-10-CM | POA: Diagnosis not present

## 2022-07-09 DIAGNOSIS — F4320 Adjustment disorder, unspecified: Secondary | ICD-10-CM | POA: Diagnosis not present

## 2022-07-09 MED ORDER — AMPHETAMINE-DEXTROAMPHETAMINE 10 MG PO TABS
ORAL_TABLET | ORAL | 0 refills | Status: DC
  Filled 2022-07-13: qty 60, 30d supply, fill #0

## 2022-07-13 ENCOUNTER — Other Ambulatory Visit (HOSPITAL_COMMUNITY): Payer: Self-pay

## 2022-07-21 ENCOUNTER — Other Ambulatory Visit (HOSPITAL_COMMUNITY): Payer: Self-pay

## 2022-07-22 ENCOUNTER — Other Ambulatory Visit (HOSPITAL_COMMUNITY): Payer: Self-pay

## 2022-07-22 MED ORDER — MONTELUKAST SODIUM 10 MG PO TABS
10.0000 mg | ORAL_TABLET | Freq: Every day | ORAL | 3 refills | Status: AC
  Filled 2022-07-22: qty 30, 30d supply, fill #0
  Filled 2022-08-11 – 2022-08-15 (×2): qty 30, 30d supply, fill #1
  Filled 2022-12-14 – 2023-02-24 (×3): qty 30, 30d supply, fill #2
  Filled 2023-04-27: qty 30, 30d supply, fill #3
  Filled 2023-07-08: qty 30, 30d supply, fill #0

## 2022-07-29 DIAGNOSIS — M9901 Segmental and somatic dysfunction of cervical region: Secondary | ICD-10-CM | POA: Diagnosis not present

## 2022-07-29 DIAGNOSIS — M5384 Other specified dorsopathies, thoracic region: Secondary | ICD-10-CM | POA: Diagnosis not present

## 2022-07-29 DIAGNOSIS — M9902 Segmental and somatic dysfunction of thoracic region: Secondary | ICD-10-CM | POA: Diagnosis not present

## 2022-07-29 DIAGNOSIS — M50122 Cervical disc disorder at C5-C6 level with radiculopathy: Secondary | ICD-10-CM | POA: Diagnosis not present

## 2022-08-06 ENCOUNTER — Other Ambulatory Visit (HOSPITAL_COMMUNITY): Payer: Self-pay

## 2022-08-06 DIAGNOSIS — Z7185 Encounter for immunization safety counseling: Secondary | ICD-10-CM | POA: Diagnosis not present

## 2022-08-06 DIAGNOSIS — R030 Elevated blood-pressure reading, without diagnosis of hypertension: Secondary | ICD-10-CM | POA: Diagnosis not present

## 2022-08-06 DIAGNOSIS — M25811 Other specified joint disorders, right shoulder: Secondary | ICD-10-CM | POA: Diagnosis not present

## 2022-08-06 DIAGNOSIS — Z23 Encounter for immunization: Secondary | ICD-10-CM | POA: Diagnosis not present

## 2022-08-06 MED ORDER — CYCLOBENZAPRINE HCL 5 MG PO TABS
5.0000 mg | ORAL_TABLET | Freq: Three times a day (TID) | ORAL | 0 refills | Status: AC
  Filled 2022-08-06: qty 30, 5d supply, fill #0

## 2022-08-06 MED ORDER — DICLOFENAC SODIUM 1 % EX GEL
1.0000 | Freq: Four times a day (QID) | CUTANEOUS | 1 refills | Status: AC
Start: 2022-08-06 — End: ?
  Filled 2022-08-06: qty 500, 63d supply, fill #0

## 2022-08-07 ENCOUNTER — Other Ambulatory Visit (HOSPITAL_COMMUNITY): Payer: Self-pay

## 2022-08-10 ENCOUNTER — Other Ambulatory Visit (HOSPITAL_COMMUNITY): Payer: Self-pay

## 2022-08-10 MED ORDER — LORAZEPAM 1 MG PO TABS
ORAL_TABLET | ORAL | 1 refills | Status: DC
  Filled 2022-08-10: qty 30, 30d supply, fill #0

## 2022-08-10 MED ORDER — AMPHETAMINE-DEXTROAMPHETAMINE 10 MG PO TABS
10.0000 mg | ORAL_TABLET | Freq: Two times a day (BID) | ORAL | 0 refills | Status: DC
  Filled 2022-08-10: qty 60, 30d supply, fill #0

## 2022-08-11 ENCOUNTER — Other Ambulatory Visit (HOSPITAL_COMMUNITY): Payer: Self-pay

## 2022-08-11 NOTE — Progress Notes (Unsigned)
   I, Peterson Lombard, LAT, ATC acting as a scribe for Lynne Leader, MD.  Subjective:    CC: R shoulder pain  HPI: Pt is a 41 y/o female c/o R shoulder pain x 3 weeks. Pt locates pain to the R trapz-rhomoid, into the R-side of her neck, all over the [facility] joint and into the R upper arm.  Pain is primary located in the right trapezius and rhomboid area.  She wonders if gabapentin would help.  Her husband has had gabapentin following her shoulder surgery and has had benefit from that.  She has had gabapentin in the past for other conditions and tolerated it well.  Neck pain: yes Radiates: yes UE Numbness/tingling: yes-rarely into R wrist UE Weakness: yes Aggravates: no motions in particular Treatments tried: massage, chiro, steroid injection by PCP, ice, flexeril, biowave, IBU  Pertinent review of Systems: No fevers or chills  Relevant historical information: History of narcotic addiction in remission.   Objective:    Vitals:   08/12/22 0834  BP: (!) 142/88  Pulse: 83  SpO2: 99%   General: Well Developed, well nourished, and in no acute distress.   MSK: C-spine: Normal appearing Nontender midline. Tender palpation right cervical paraspinal musculature, right trapezius, and right rhomboid. Normal cervical motion. Negative Spurling's test. Upper extremity strength and reflexes are intact.  Right shoulder normal.  Normal motion normal strength negative impingement testing.      Impression and Recommendations:    Assessment and Plan: 41 y.o. female with right cervical paraspinal musculature and right trapezius and rhomboid muscle spasm and dysfunction.  She may originally have had some rotator cuff impingement or even cervical radiculopathy but this seems to be less of a problem now.  She is a great candidate for physical therapy at this point.  Plan to refer to PT.  We discussed muscle relaxers.  She already has a prescription for cyclobenzaprine but finds it too sedating.   She is interested in gabapentin which is reasonable.  We will go ahead and prescribe gabapentin for use at bedtime.  Check back if not improving.  PDMP not reviewed this encounter. Orders Placed This Encounter  Procedures   [place] LIMITED JOINT SPACE STRUCTURES UP RIGHT(NO LINKED CHARGES)    Order Specific Question:   Reason for Exam (SYMPTOM  OR DIAGNOSIS REQUIRED)    Answer:   right shoulder pain    Order Specific Question:   Preferred imaging location?    Answer:   [place]   Ambulatory referral to Physical Therapy    Referral Priority:   Routine    Referral Type:   Physical Medicine    Referral Reason:   Specialty Services Required    Requested Specialty:   Physical Therapy    Number of Visits Requested:   1   Meds ordered this encounter  Medications   gabapentin (NEURONTIN) 300 MG capsule    Sig: Take 1 capsule (300 mg total) by mouth at bedtime as needed (nerve pain).    Dispense:  30 capsule    Refill:  3    Discussed warning signs or symptoms. Please see discharge instructions. Patient expresses understanding.   The above documentation has been reviewed and is accurate and complete Lynne Leader, M.D.

## 2022-08-12 ENCOUNTER — Other Ambulatory Visit (HOSPITAL_BASED_OUTPATIENT_CLINIC_OR_DEPARTMENT_OTHER): Payer: Self-pay

## 2022-08-12 ENCOUNTER — Other Ambulatory Visit (HOSPITAL_COMMUNITY): Payer: Self-pay

## 2022-08-12 ENCOUNTER — Ambulatory Visit: Payer: Self-pay

## 2022-08-12 ENCOUNTER — Ambulatory Visit (INDEPENDENT_AMBULATORY_CARE_PROVIDER_SITE_OTHER): Payer: BC Managed Care – PPO | Admitting: Family Medicine

## 2022-08-12 VITALS — BP 142/88 | HR 83 | Ht 65.0 in | Wt 153.0 lb

## 2022-08-12 DIAGNOSIS — M25511 Pain in right shoulder: Secondary | ICD-10-CM

## 2022-08-12 DIAGNOSIS — G8929 Other chronic pain: Secondary | ICD-10-CM | POA: Diagnosis not present

## 2022-08-12 DIAGNOSIS — M62838 Other muscle spasm: Secondary | ICD-10-CM | POA: Diagnosis not present

## 2022-08-12 MED ORDER — GABAPENTIN 300 MG PO CAPS
300.0000 mg | ORAL_CAPSULE | Freq: Every evening | ORAL | 3 refills | Status: AC | PRN
  Filled 2022-08-12 (×2): qty 30, 30d supply, fill #0
  Filled 2022-09-12: qty 30, 30d supply, fill #1

## 2022-08-12 NOTE — Patient Instructions (Addendum)
Thank you for coming in today.   I've referred you to Physical Therapy.  Let us know if you don't hear from them in one week.   Heating pad and TENS unit.   Recheck if not improving.   Let me know if you have a problem.   Use gabapentin as needed mostly at bedtime.

## 2022-08-14 ENCOUNTER — Ambulatory Visit (INDEPENDENT_AMBULATORY_CARE_PROVIDER_SITE_OTHER): Payer: BC Managed Care – PPO | Admitting: Physical Therapy

## 2022-08-14 ENCOUNTER — Encounter: Payer: Self-pay | Admitting: Physical Therapy

## 2022-08-14 DIAGNOSIS — M542 Cervicalgia: Secondary | ICD-10-CM | POA: Diagnosis not present

## 2022-08-14 DIAGNOSIS — M25511 Pain in right shoulder: Secondary | ICD-10-CM | POA: Diagnosis not present

## 2022-08-14 NOTE — Therapy (Signed)
OUTPATIENT PHYSICAL THERAPY UPPER EXTREMITY EVALUATION   Patient Name: Alexandra Mcdaniel MRN: [id number] DOB:[birth year], 41 y.o., female Today's Date: 08/14/2022   PT End of Session - 08/14/22 1322     Visit Number 1    Number of Visits 16    Date for PT Re-Evaluation 10/09/22    Authorization Type BCBS    PT Start Time 1102    PT Stop Time 1140    PT Time Calculation (min) 38 min    Activity Tolerance Patient tolerated treatment well    Behavior During Therapy [facility] for tasks assessed/performed             Past Medical History:  Diagnosis Date   Allergy    History of chickenpox    Past Surgical History:  Procedure Laterality Date   CESAREAN SECTION  2017   Patient Active Problem List   Diagnosis Date Noted   Anxiety and depression 10/15/2020   Chronic left-sided low back pain with left-sided sciatica 10/15/2020   Inattention 06/30/2019   Adjustment reaction 06/30/2019   History of narcotic addiction ([place]) 06/30/2019    PCP: Rachell Cipro   REFERRING PROVIDER: Lynne Leader   REFERRING DIAG: R neck and shoulder pain.   THERAPY DIAG:  Cervicalgia  Acute pain of right shoulder  Rationale for Evaluation and Treatment Rehabilitation  ONSET DATE:   SUBJECTIVE:                                                                                                                                                                                      SUBJECTIVE STATEMENT:  R sided shoulder and neck pain, no incident to report, x 1 mo. Did start gabapentin.  Pt works full time, at desk.  Worse later in day, after sitting /working.  Driving pain. R handed.  States pain started previously in R shoulder, had injection by pcp, and shoulder has been fine since, pain now moved into neck.    PERTINENT HISTORY:   PAIN:  Are you having pain? Yes: NPRS scale: 5/10 Pain location: R UT and neck  Pain description: tight, sore  Aggravating factors: end of day/ work day  Relieving  factors: laying down  PRECAUTIONS: None  WEIGHT BEARING RESTRICTIONS No  FALLS:  Has patient fallen in last 6 months? No  PLOF: Independent  PATIENT GOALS  decreased pain , get back to regular activity.    OBJECTIVE:   DIAGNOSTIC FINDINGS:    COGNITION:  Overall cognitive status: Within functional limits for tasks assessed     SENSATION: WFL  POSTURE:   UPPER EXTREMITY ROM:   Shoulder ROM: WFL Neck: WFL, very mild soreness on R  with R rotation    UPPER EXTREMITY MMT:  MMT Right eval Left eval  Shoulder flexion 4+ 4+  Shoulder extension    Shoulder abduction 4+ 4+  Shoulder adduction    Shoulder internal rotation 4+ 4+  Shoulder external rotation 4+ 4+  Middle trapezius    Lower trapezius    Elbow flexion    Elbow extension    Wrist flexion    Wrist extension    Wrist ulnar deviation    Wrist radial deviation    Wrist pronation    Wrist supination    Grip strength (lbs)    (Blank rows = not tested)  SHOULDER SPECIAL TESTS:  Neg empty can,   JOINT MOBILITY TESTING:  wfl  PALPATION:  No pain to palpate shoulder,  Tightness and soreness mostly in R levator, mild into R UT.    TODAY'S TREATMENT:  Ther ex: See below for HEP Manual: DTM/TPR to R UT and levator.    PATIENT EDUCATION: Education details: PT POC, exam findings, HEP Person educated: Patient Education method: Explanation, Demonstration, Tactile cues, Verbal cues, and Handouts Education comprehension: verbalized understanding, returned demonstration, verbal cues required, tactile cues required, and needs further education   HOME EXERCISE PROGRAM: Access Code: [email or web address] URL: [email or web address] Date: 08/14/2022 Prepared by: Lyndee Hensen  Exercises - Seated Upper Trapezius Stretch  - 2 x daily - 3 reps - 30 hold - Seated Levator Scapulae Stretch  - 3 x daily - 3 reps - 30 hold - Standing Scapular Retraction  - 3 x daily - 1 sets - 10 reps - Standing Backward  Shoulder Rolls  - 3 x daily - 1 sets - 10 reps    ASSESSMENT:  CLINICAL IMPRESSION: Patient presents with primary complaint of increased pain in R side of neck. She has increased muscle tension in R levator and UT. She has minimal pain to test R shoulder today, but did have pain there recently as well. She will benefit from postural education and strengthening, as well as HEP for RTC strength. Pt with decreased ability for for full functional activities due to pain, and will benefit from skilled PT to improve deficits.    OBJECTIVE IMPAIRMENTS decreased activity tolerance, decreased mobility, decreased ROM, decreased strength, increased muscle spasms, impaired flexibility, improper body mechanics, and pain.   ACTIVITY LIMITATIONS carrying, lifting, reach over head, and locomotion level  PARTICIPATION LIMITATIONS: cleaning, laundry, driving, shopping, community activity, occupation, and yard work  PERSONAL FACTORS  none  are also affecting patient's functional outcome.   REHAB POTENTIAL: Good  CLINICAL DECISION MAKING: Stable/uncomplicated  EVALUATION COMPLEXITY: Low   GOALS: Goals reviewed with patient? Yes  SHORT TERM GOALS: Target date: 08/28/2022   Pt to be independent with initial HEP.   Goal status: INITIAL   2.  Pt to report decreased pain in R side of neck by at least 50%.  Goal status: INITIAL      LONG TERM GOALS: Target date: 10/09/2022     Pt to be independent with final HEP  Goal status: INITIAL  2.  Pt to report decreased pain in R neck/shoulder to 0-2/10 with activity and work duties.   Goal status: INITIAL  3.  Pt to demo full ROM in c-spine with no pain, to improve ability for driving and ADLs.   Goal status: INITIAL  4.  Pt to demo soft tissue limitations to be [facility] for decreased pain in R neck/shoulder.   Goal status: INITIAL     PLAN: PT  FREQUENCY: 1-2x/week  PT DURATION: 8 weeks  PLANNED INTERVENTIONS: Therapeutic exercises,  Therapeutic activity, Neuromuscular re-education, Patient/Family education, Self Care, Joint mobilization, Joint manipulation, DME instructions, Dry Needling, Electrical stimulation, Spinal manipulation, Spinal mobilization, Cryotherapy, Moist heat, Taping, Traction, Ultrasound, Ionotophoresis 4mg /ml Dexamethasone, and Manual therapy  PLAN FOR NEXT SESSION: manual for R UT and levator muscle tension , postural strengthening. Possible dry needling   Lyndee Hensen, PT, DPT 1:28 PM  08/14/22

## 2022-08-15 ENCOUNTER — Other Ambulatory Visit (HOSPITAL_COMMUNITY): Payer: Self-pay

## 2022-08-18 ENCOUNTER — Other Ambulatory Visit (HOSPITAL_COMMUNITY): Payer: Self-pay

## 2022-08-20 ENCOUNTER — Encounter: Payer: Self-pay | Admitting: Physical Therapy

## 2022-08-20 ENCOUNTER — Ambulatory Visit (INDEPENDENT_AMBULATORY_CARE_PROVIDER_SITE_OTHER): Payer: BC Managed Care – PPO | Admitting: Physical Therapy

## 2022-08-20 DIAGNOSIS — M25511 Pain in right shoulder: Secondary | ICD-10-CM

## 2022-08-20 DIAGNOSIS — M542 Cervicalgia: Secondary | ICD-10-CM | POA: Diagnosis not present

## 2022-08-20 NOTE — Therapy (Signed)
OUTPATIENT PHYSICAL THERAPY UPPER EXTREMITY TREATMENT    Patient Name: Alexandra Mcdaniel MRN: [id number] DOB:09-May-1981, 41 y.o., female Today's Date: 08/20/2022   PT End of Session - 08/20/22 1606     Visit Number 2    Number of Visits 16    Date for PT Re-Evaluation 10/09/22    Authorization Type BCBS    PT Start Time 1431    PT Stop Time 1512    PT Time Calculation (min) 41 min    Activity Tolerance Patient tolerated treatment well    Behavior During Therapy [facility] for tasks assessed/performed              Past Medical History:  Diagnosis Date   Allergy    History of chickenpox    Past Surgical History:  Procedure Laterality Date   CESAREAN SECTION  2017   Patient Active Problem List   Diagnosis Date Noted   Anxiety and depression 10/15/2020   Chronic left-sided low back pain with left-sided sciatica 10/15/2020   Inattention 06/30/2019   Adjustment reaction 06/30/2019   History of narcotic addiction (HCC) 06/30/2019    PCP: Maryelizabeth Rowan   REFERRING PROVIDER: Clementeen Graham   REFERRING DIAG: R neck and shoulder pain.   THERAPY DIAG:  Cervicalgia  Acute pain of right shoulder  Rationale for Evaluation and Treatment Rehabilitation  ONSET DATE:   SUBJECTIVE:                                                                                                                                                                                      SUBJECTIVE STATEMENT: Pt has been using heat, most muscle tension on R. Has been a bit better, but thinks she sleeps on that side, causing pain.   Eval:R sided shoulder and neck pain, no incident to report, x 1 mo. Did start gabapentin.  Pt works full time, at desk.  Worse later in day, after sitting /working.  Driving pain. R handed.  States pain started previously in R shoulder, had injection by pcp, and shoulder has been fine since, pain now moved into neck.    PERTINENT HISTORY:   PAIN:  Are you having pain? Yes:  NPRS scale: 5/10 Pain location: R UT and neck  Pain description: tight, sore  Aggravating factors: end of day/ work day  Relieving factors: laying down  PRECAUTIONS: None  WEIGHT BEARING RESTRICTIONS No  FALLS:  Has patient fallen in last 6 months? No  PLOF: Independent  PATIENT GOALS  decreased pain , get back to regular activity.    OBJECTIVE:   DIAGNOSTIC FINDINGS:    COGNITION:  Overall cognitive status: Within functional limits for  tasks assessed     SENSATION: WFL  POSTURE:   UPPER EXTREMITY ROM:   Shoulder ROM: WFL Neck: WFL, very mild soreness on R with R rotation    UPPER EXTREMITY MMT:  MMT Right eval Left eval  Shoulder flexion 4+ 4+  Shoulder extension    Shoulder abduction 4+ 4+  Shoulder adduction    Shoulder internal rotation 4+ 4+  Shoulder external rotation 4+ 4+  Middle trapezius    Lower trapezius    Elbow flexion    Elbow extension    Wrist flexion    Wrist extension    Wrist ulnar deviation    Wrist radial deviation    Wrist pronation    Wrist supination    Grip strength (lbs)    (Blank rows = not tested)  SHOULDER SPECIAL TESTS:  Neg empty can,   JOINT MOBILITY TESTING:  wfl  PALPATION:  No pain to palpate shoulder,  Tightness and soreness mostly in R levator, mild into R UT.    TODAY'S TREATMENT:   08/20/22: Therapeutic Exercise: Aerobic: Supine: SA presses 2x10 ;  S/L shoulder ER 2x10 on R, 2 lb;  Seated: Standing: Rows GTB 2x10 ; Bil ER YTB x 20; Wall push ups 2x10;  Stretches: Upper trap and levator stretches 30 sec x 3 ea bil;  Neuromuscular Re-education: Manual Therapy: STM/TPR to R UT, levator, R paraspinals. Manual UT and levator stretches bil; Light cervical distraction 10 sec x 8;      PATIENT EDUCATION: Education details: PT POC, exam findings, HEP Person educated: Patient Education method: Explanation, Demonstration, Tactile cues, Verbal cues, and Handouts Education comprehension: verbalized  understanding, returned demonstration, verbal cues required, tactile cues required, and needs further education   HOME EXERCISE PROGRAM: Access Code: [email or web address]    ASSESSMENT:  CLINICAL IMPRESSION: 08/20/2022  Pt with continued muscle tension and soreness with palpation on R, but overall having less pain than last week. Discussed sleeping positions, and trying to avoid sleeping on R if possible. Pt able to perform shoulder/ Rtc strengthening with fatigue but no pain today. Plan to progress shoulder and postural strength as tolerated.   Eval: Patient presents with primary complaint of increased pain in R side of neck. She has increased muscle tension in R levator and UT. She has minimal pain to test R shoulder today, but did have pain there recently as well. She will benefit from postural education and strengthening, as well as HEP for RTC strength. Pt with decreased ability for for full functional activities due to pain, and will benefit from skilled PT to improve deficits.    OBJECTIVE IMPAIRMENTS decreased activity tolerance, decreased mobility, decreased ROM, decreased strength, increased muscle spasms, impaired flexibility, improper body mechanics, and pain.   ACTIVITY LIMITATIONS carrying, lifting, reach over head, and locomotion level  PARTICIPATION LIMITATIONS: cleaning, laundry, driving, shopping, community activity, occupation, and yard work  PERSONAL FACTORS  none  are also affecting patient's functional outcome.   REHAB POTENTIAL: Good  CLINICAL DECISION MAKING: Stable/uncomplicated  EVALUATION COMPLEXITY: Low   GOALS: Goals reviewed with patient? Yes  SHORT TERM GOALS: Target date: 08/28/2022   Pt to be independent with initial HEP.   Goal status: INITIAL   2.  Pt to report decreased pain in R side of neck by at least 50%.  Goal status: INITIAL      LONG TERM GOALS: Target date: 10/09/2022     Pt to be independent with final HEP  Goal status:  INITIAL  2.  Pt to report decreased pain in R neck/shoulder to 0-2/10 with activity and work duties.   Goal status: INITIAL  3.  Pt to demo full ROM in c-spine with no pain, to improve ability for driving and ADLs.   Goal status: INITIAL  4.  Pt to demo soft tissue limitations to be [facility] for decreased pain in R neck/shoulder.   Goal status: INITIAL     PLAN: PT FREQUENCY: 1-2x/week  PT DURATION: 8 weeks  PLANNED INTERVENTIONS: Therapeutic exercises, Therapeutic activity, Neuromuscular re-education, Patient/Family education, Self Care, Joint mobilization, Joint manipulation, DME instructions, Dry Needling, Electrical stimulation, Spinal manipulation, Spinal mobilization, Cryotherapy, Moist heat, Taping, Traction, Ultrasound, Ionotophoresis 4mg /ml Dexamethasone, and Manual therapy  PLAN FOR NEXT SESSION: manual for R UT and levator muscle tension , postural strengthening. Possible dry needling   Lyndee Hensen, PT, DPT 4:08 PM  08/20/22

## 2022-08-21 ENCOUNTER — Other Ambulatory Visit (HOSPITAL_COMMUNITY): Payer: Self-pay

## 2022-08-26 ENCOUNTER — Ambulatory Visit (INDEPENDENT_AMBULATORY_CARE_PROVIDER_SITE_OTHER): Payer: BC Managed Care – PPO | Admitting: Physical Therapy

## 2022-08-26 ENCOUNTER — Encounter: Payer: Self-pay | Admitting: Physical Therapy

## 2022-08-26 DIAGNOSIS — M542 Cervicalgia: Secondary | ICD-10-CM

## 2022-08-26 DIAGNOSIS — M25511 Pain in right shoulder: Secondary | ICD-10-CM | POA: Diagnosis not present

## 2022-08-26 NOTE — Therapy (Signed)
OUTPATIENT PHYSICAL THERAPY UPPER EXTREMITY TREATMENT    Patient Name: Alexandra Mcdaniel MRN: [id number] DOB:1981-10-08, 41 y.o., female Today's Date: 08/26/2022   PT End of Session - 08/26/22 1238     Visit Number 3    Number of Visits 16    Date for PT Re-Evaluation 10/09/22    Authorization Type [employer]    PT Start Time 1223    PT Stop Time 1301    PT Time Calculation (min) 38 min    Activity Tolerance Patient tolerated treatment well    Behavior During Therapy [facility] for tasks assessed/performed               Past Medical History:  Diagnosis Date   Allergy    History of chickenpox    Past Surgical History:  Procedure Laterality Date   CESAREAN SECTION  2017   Patient Active Problem List   Diagnosis Date Noted   Anxiety and depression 10/15/2020   Chronic left-sided low back pain with left-sided sciatica 10/15/2020   Inattention 06/30/2019   Adjustment reaction 06/30/2019   History of narcotic addiction ([place]) 06/30/2019    PCP: Rachell Cipro   REFERRING PROVIDER: Lynne Leader   REFERRING DIAG: R neck and shoulder pain.   THERAPY DIAG:  Cervicalgia  Acute pain of right shoulder  Rationale for Evaluation and Treatment Rehabilitation  ONSET DATE:   SUBJECTIVE:                                                                                                                                                                                      SUBJECTIVE STATEMENT: Pt with improving pain. States some tigtness on R side.   Eval:R sided shoulder and neck pain, no incident to report, x 1 mo. Did start gabapentin.  Pt works full time, at desk.  Worse later in day, after sitting /working.  Driving pain. R handed.  States pain started previously in R shoulder, had injection by pcp, and shoulder has been fine since, pain now moved into neck.    PERTINENT HISTORY:   PAIN:  Are you having pain? Yes: NPRS scale: 5/10 Pain location: R UT and neck  Pain description:  tight, sore  Aggravating factors: end of day/ work day  Relieving factors: laying down  PRECAUTIONS: None  WEIGHT BEARING RESTRICTIONS No  FALLS:  Has patient fallen in last 6 months? No  PLOF: Independent  PATIENT GOALS  decreased pain , get back to regular activity.    OBJECTIVE:   DIAGNOSTIC FINDINGS:    COGNITION:  Overall cognitive status: Within functional limits for tasks assessed     SENSATION: WFL  POSTURE:   UPPER  EXTREMITY ROM:   Shoulder ROM: WFL Neck: WFL, very mild soreness on R with R rotation    UPPER EXTREMITY MMT:  MMT Right eval Left eval  Shoulder flexion 4+ 4+  Shoulder extension    Shoulder abduction 4+ 4+  Shoulder adduction    Shoulder internal rotation 4+ 4+  Shoulder external rotation 4+ 4+  Middle trapezius    Lower trapezius    Elbow flexion    Elbow extension    Wrist flexion    Wrist extension    Wrist ulnar deviation    Wrist radial deviation    Wrist pronation    Wrist supination    Grip strength (lbs)    (Blank rows = not tested)  SHOULDER SPECIAL TESTS:  Neg empty can,   JOINT MOBILITY TESTING:  wfl  PALPATION:  No pain to palpate shoulder,  Tightness and soreness mostly in R levator, mild into R UT.    TODAY'S TREATMENT:   08/26/22: Therapeutic Exercise: Aerobic: Supine: SA presses 2x10 ;  S/L shoulder ER 2x10 on R, 2 lb; High plank 20 sec x 3;  Seated: Standing: Rows BlueTB 2x10 ; Bil ER YTB x 20; Wall push ups 2x10;   Scaption ROM x 10, Abd ROM to 90 deg x 10, overhead press x 10;  Stretches: Upper trap and levator stretches 30 sec x 3 ea bil;  Neuromuscular Re-education: Manual Therapy: STM/TPR to R UT, levator,scalenes,  Manual UT and levator stretches bil; Light cervical distraction 10 sec x 10     08/20/22: Therapeutic Exercise: Aerobic: Supine: SA presses 2x10 ;  S/L shoulder ER 2x10 on R, 2 lb;  Seated: Standing: Rows GTB 2x10 ; Bil ER YTB x 20; Wall push ups 2x10;  Stretches: Upper  trap and levator stretches 30 sec x 3 ea bil;  Neuromuscular Re-education: Manual Therapy: STM/TPR to R UT, levator, R paraspinals. Manual UT and levator stretches bil; Light cervical distraction 10 sec x 8;      PATIENT EDUCATION: Education details: PT POC, exam findings, HEP Person educated: Patient Education method: Explanation, Demonstration, Tactile cues, Verbal cues, and Handouts Education comprehension: verbalized understanding, returned demonstration, verbal cues required, tactile cues required, and needs further education   HOME EXERCISE PROGRAM: Access Code: [id number]    ASSESSMENT:  CLINICAL IMPRESSION: 08/26/2022  Pt with improving pain and muscle tension. She has mild tension on R UT into scalenes today. She is challenged with ther ex due to weakness in postural muscles but improved tolerance today from last visit. Plan to progress as tolerated.   Eval: Patient presents with primary complaint of increased pain in R side of neck. She has increased muscle tension in R levator and UT. She has minimal pain to test R shoulder today, but did have pain there recently as well. She will benefit from postural education and strengthening, as well as HEP for RTC strength. Pt with decreased ability for for full functional activities due to pain, and will benefit from skilled PT to improve deficits.    OBJECTIVE IMPAIRMENTS decreased activity tolerance, decreased mobility, decreased ROM, decreased strength, increased muscle spasms, impaired flexibility, improper body mechanics, and pain.   ACTIVITY LIMITATIONS carrying, lifting, reach over head, and locomotion level  PARTICIPATION LIMITATIONS: cleaning, laundry, driving, shopping, community activity, occupation, and yard work  PERSONAL FACTORS  none  are also affecting patient's functional outcome.   REHAB POTENTIAL: Good  CLINICAL DECISION MAKING: Stable/uncomplicated  EVALUATION COMPLEXITY: Low   GOALS: Goals reviewed  with  patient? Yes  SHORT TERM GOALS: Target date: 08/28/2022   Pt to be independent with initial HEP.   Goal status: INITIAL   2.  Pt to report decreased pain in R side of neck by at least 50%.  Goal status: INITIAL      LONG TERM GOALS: Target date: 10/09/2022     Pt to be independent with final HEP  Goal status: INITIAL  2.  Pt to report decreased pain in R neck/shoulder to 0-2/10 with activity and work duties.   Goal status: INITIAL  3.  Pt to demo full ROM in c-spine with no pain, to improve ability for driving and ADLs.   Goal status: INITIAL  4.  Pt to demo soft tissue limitations to be [facility] for decreased pain in R neck/shoulder.   Goal status: INITIAL     PLAN: PT FREQUENCY: 1-2x/week  PT DURATION: 8 weeks  PLANNED INTERVENTIONS: Therapeutic exercises, Therapeutic activity, Neuromuscular re-education, Patient/Family education, Self Care, Joint mobilization, Joint manipulation, DME instructions, Dry Needling, Electrical stimulation, Spinal manipulation, Spinal mobilization, Cryotherapy, Moist heat, Taping, Traction, Ultrasound, Ionotophoresis 4mg /ml Dexamethasone, and Manual therapy  PLAN FOR NEXT SESSION: manual for R UT and levator muscle tension , postural strengthening. Possible dry needling   Lyndee Hensen, PT, DPT 1:54 PM  08/26/22

## 2022-08-31 DIAGNOSIS — E7800 Pure hypercholesterolemia, unspecified: Secondary | ICD-10-CM | POA: Diagnosis not present

## 2022-09-02 ENCOUNTER — Other Ambulatory Visit (HOSPITAL_BASED_OUTPATIENT_CLINIC_OR_DEPARTMENT_OTHER): Payer: Self-pay

## 2022-09-02 ENCOUNTER — Encounter: Payer: BC Managed Care – PPO | Admitting: Physical Therapy

## 2022-09-02 DIAGNOSIS — F3281 Premenstrual dysphoric disorder: Secondary | ICD-10-CM | POA: Diagnosis not present

## 2022-09-02 DIAGNOSIS — R030 Elevated blood-pressure reading, without diagnosis of hypertension: Secondary | ICD-10-CM | POA: Diagnosis not present

## 2022-09-02 DIAGNOSIS — J309 Allergic rhinitis, unspecified: Secondary | ICD-10-CM | POA: Diagnosis not present

## 2022-09-02 DIAGNOSIS — M25811 Other specified joint disorders, right shoulder: Secondary | ICD-10-CM | POA: Diagnosis not present

## 2022-09-02 MED ORDER — IPRATROPIUM BROMIDE 0.06 % NA SOLN
2.0000 | Freq: Three times a day (TID) | NASAL | 0 refills | Status: DC | PRN
  Filled 2022-09-02: qty 15, 25d supply, fill #0

## 2022-09-09 ENCOUNTER — Ambulatory Visit (INDEPENDENT_AMBULATORY_CARE_PROVIDER_SITE_OTHER): Payer: BC Managed Care – PPO | Admitting: Physical Therapy

## 2022-09-09 ENCOUNTER — Encounter: Payer: Self-pay | Admitting: Physical Therapy

## 2022-09-09 DIAGNOSIS — M25511 Pain in right shoulder: Secondary | ICD-10-CM | POA: Diagnosis not present

## 2022-09-09 DIAGNOSIS — M542 Cervicalgia: Secondary | ICD-10-CM

## 2022-09-09 NOTE — Therapy (Signed)
OUTPATIENT PHYSICAL THERAPY UPPER EXTREMITY TREATMENT    Patient Name: Alexandra Mcdaniel MRN: [id number] DOB:11-07-1981, 41 y.o., female Today's Date: 09/09/2022   PT End of Session - 09/09/22 1226     Visit Number 4    Number of Visits 16    Date for PT Re-Evaluation 10/09/22    Authorization Type [employer]    PT Start Time 1220    PT Stop Time 1779    PT Time Calculation (min) 38 min    Activity Tolerance Patient tolerated treatment well    Behavior During Therapy [facility] for tasks assessed/performed                Past Medical History:  Diagnosis Date   Allergy    History of chickenpox    Past Surgical History:  Procedure Laterality Date   CESAREAN SECTION  2017   Patient Active Problem List   Diagnosis Date Noted   Anxiety and depression 10/15/2020   Chronic left-sided low back pain with left-sided sciatica 10/15/2020   Inattention 06/30/2019   Adjustment reaction 06/30/2019   History of narcotic addiction ([place]) 06/30/2019    PCP: Rachell Cipro   REFERRING PROVIDER: Lynne Leader   REFERRING DIAG: R neck and shoulder pain.   THERAPY DIAG:  Cervicalgia  Acute pain of right shoulder  Rationale for Evaluation and Treatment Rehabilitation  ONSET DATE:   SUBJECTIVE:                                                                                                                                                                                      SUBJECTIVE STATEMENT: Pt with improving pain. Has had minimal pain in last couple weeks. Still feels tightness on R side when she wakes up.   Eval:R sided shoulder and neck pain, no incident to report, x 1 mo. Did start gabapentin.  Pt works full time, at desk.  Worse later in day, after sitting /working.  Driving pain. R handed.  States pain started previously in R shoulder, had injection by pcp, and shoulder has been fine since, pain now moved into neck.    PERTINENT HISTORY:   PAIN:  Are you having pain? Yes:  NPRS scale: 0-2/10 Pain location: R UT and neck  Pain description: tight, sore  Aggravating factors: end of day/ work day  Relieving factors: laying down  PRECAUTIONS: None  WEIGHT BEARING RESTRICTIONS No  FALLS:  Has patient fallen in last 6 months? No  PLOF: Independent  PATIENT GOALS  decreased pain , get back to regular activity.    OBJECTIVE:   DIAGNOSTIC FINDINGS:    COGNITION:  Overall cognitive status: Within functional limits for  tasks assessed     SENSATION: WFL  POSTURE:   UPPER EXTREMITY ROM:   Shoulder ROM: WFL Neck: WFL,    UPPER EXTREMITY MMT:  MMT Right eval Left eval  Shoulder flexion 4+ 4+  Shoulder extension    Shoulder abduction 4+ 4+  Shoulder adduction    Shoulder internal rotation 4+ 4+  Shoulder external rotation 4+ 4+  Middle trapezius    Lower trapezius    Elbow flexion    Elbow extension    Wrist flexion    Wrist extension    Wrist ulnar deviation    Wrist radial deviation    Wrist pronation    Wrist supination    Grip strength (lbs)    (Blank rows = not tested)  SHOULDER SPECIAL TESTS:  Neg empty can,   JOINT MOBILITY TESTING:  wfl  PALPATION:     TODAY'S TREATMENT:   09/09/22: Therapeutic Exercise: Aerobic: Supine: SA presses 2x10 ;  S/L shoulder ER 2x10 on R, 2 lb;  Seated: Standing: Rows BlueTB 2x10 ; Bil ER RTB x 20; Wall push ups 2x10;   Scaption ROM x 10,  Stretches: Reviewed Upper trap and levator stretches 30 sec x 3 ea bil;  Neuromuscular Re-education: Manual Therapy: STM/TPR to R UT, levator,  Manual UT and levator stretches bil; Light cervical distraction ;    08/26/22: Therapeutic Exercise: Aerobic: Supine: SA presses 2x10 ;  S/L shoulder ER 2x10 on R, 2 lb; High plank 20 sec x 3;  Seated: Standing: Rows BlueTB 2x10 ; Bil ER YTB x 20; Wall push ups 2x10;   Scaption ROM x 10, Abd ROM to 90 deg x 10, overhead press x 10;  Stretches: Upper trap and levator stretches 30 sec x 3 ea bil;   Neuromuscular Re-education: Manual Therapy: STM/TPR to R UT, levator,scalenes,  Manual UT and levator stretches bil; Light cervical distraction 10 sec x 10     PATIENT EDUCATION: Education details: PT POC, exam findings, HEP Person educated: Patient Education method: Explanation, Demonstration, Tactile cues, Verbal cues, and Handouts Education comprehension: verbalized understanding, returned demonstration, verbal cues required, tactile cues required, and needs further education   HOME EXERCISE PROGRAM: Access Code: [email or web address]    ASSESSMENT:  CLINICAL IMPRESSION: 09/09/2022  Pt making good improvements in pain and muscle tension. Pt with minimal pain and feels she is doing very well, has been compliant with HEP. She has improved neck ROM, with no pain with rotation today. Reviewed HEP , with strengthening for shoulder as well, and reviewed importance of continuing. Also will continue to focus on posture breaks during work day. Pt requests d/c at this time, she has met goals at this time, and is ready for d/c to HEP.   Eval: Patient presents with primary complaint of increased pain in R side of neck. She has increased muscle tension in R levator and UT. She has minimal pain to test R shoulder today, but did have pain there recently as well. She will benefit from postural education and strengthening, as well as HEP for RTC strength. Pt with decreased ability for for full functional activities due to pain, and will benefit from skilled PT to improve deficits.    OBJECTIVE IMPAIRMENTS decreased activity tolerance, decreased mobility, decreased ROM, decreased strength, increased muscle spasms, impaired flexibility, improper body mechanics, and pain.   ACTIVITY LIMITATIONS carrying, lifting, reach over head, and locomotion level  PARTICIPATION LIMITATIONS: cleaning, laundry, driving, shopping, community activity, occupation, and yard work  PERSONAL FACTORS  none  are also affecting  patient's functional outcome.   REHAB POTENTIAL: Good  CLINICAL DECISION MAKING: Stable/uncomplicated  EVALUATION COMPLEXITY: Low   GOALS: Goals reviewed with patient? Yes  SHORT TERM GOALS: Target date: 08/28/2022   Pt to be independent with initial HEP.   Goal status: MET   2.  Pt to report decreased pain in R side of neck by at least 50%.  Goal status: MET      LONG TERM GOALS: Target date: 10/09/2022     Pt to be independent with final HEP  Goal status: MET  2.  Pt to report decreased pain in R neck/shoulder to 0-2/10 with activity and work duties.   Goal status: MET  3.  Pt to demo full ROM in c-spine with no pain, to improve ability for driving and ADLs.   Goal status: MET  4.  Pt to demo soft tissue limitations to be [facility] for decreased pain in R neck/shoulder.   Goal status: MET     PLAN: PT FREQUENCY: 1-2x/week  PT DURATION: 8 weeks  PLANNED INTERVENTIONS: Therapeutic exercises, Therapeutic activity, Neuromuscular re-education, Patient/Family education, Self Care, Joint mobilization, Joint manipulation, DME instructions, Dry Needling, Electrical stimulation, Spinal manipulation, Spinal mobilization, Cryotherapy, Moist heat, Taping, Traction, Ultrasound, Ionotophoresis 39m/ml Dexamethasone, and Manual therapy  PLAN FOR NEXT SESSION:    LLyndee Hensen PT, DPT 1:14 PM  09/09/22  PHYSICAL THERAPY DISCHARGE SUMMARY  Visits from Start of Care: 4  Plan: Patient agrees to discharge.  Patient goals were met. Patient is being discharged due to meeting the stated rehab goals.     LLyndee Hensen PT, DPT 1:19 PM  09/09/22

## 2022-09-12 ENCOUNTER — Other Ambulatory Visit (HOSPITAL_COMMUNITY): Payer: Self-pay

## 2022-09-14 ENCOUNTER — Other Ambulatory Visit (HOSPITAL_COMMUNITY): Payer: Self-pay

## 2022-09-14 MED ORDER — AMPHETAMINE-DEXTROAMPHETAMINE 10 MG PO TABS
10.0000 mg | ORAL_TABLET | Freq: Two times a day (BID) | ORAL | 0 refills | Status: AC
  Filled 2022-09-14: qty 60, 30d supply, fill #0

## 2022-09-15 ENCOUNTER — Other Ambulatory Visit (HOSPITAL_COMMUNITY): Payer: Self-pay

## 2022-09-15 DIAGNOSIS — M47812 Spondylosis without myelopathy or radiculopathy, cervical region: Secondary | ICD-10-CM | POA: Diagnosis not present

## 2022-09-17 DIAGNOSIS — F3342 Major depressive disorder, recurrent, in full remission: Secondary | ICD-10-CM | POA: Diagnosis not present

## 2022-09-17 DIAGNOSIS — F4310 Post-traumatic stress disorder, unspecified: Secondary | ICD-10-CM | POA: Diagnosis not present

## 2022-09-17 DIAGNOSIS — F900 Attention-deficit hyperactivity disorder, predominantly inattentive type: Secondary | ICD-10-CM | POA: Diagnosis not present

## 2022-09-28 ENCOUNTER — Other Ambulatory Visit (HOSPITAL_BASED_OUTPATIENT_CLINIC_OR_DEPARTMENT_OTHER): Payer: Self-pay

## 2022-09-28 MED ORDER — AMPHETAMINE-DEXTROAMPHETAMINE 20 MG PO TABS
20.0000 mg | ORAL_TABLET | Freq: Two times a day (BID) | ORAL | 0 refills | Status: DC
  Filled 2022-09-28: qty 60, 30d supply, fill #0

## 2022-10-10 ENCOUNTER — Other Ambulatory Visit (HOSPITAL_COMMUNITY): Payer: Self-pay

## 2022-10-12 ENCOUNTER — Other Ambulatory Visit (HOSPITAL_COMMUNITY): Payer: Self-pay

## 2022-10-12 ENCOUNTER — Encounter (HOSPITAL_COMMUNITY): Payer: Self-pay

## 2022-10-27 ENCOUNTER — Other Ambulatory Visit (HOSPITAL_COMMUNITY): Payer: Self-pay

## 2022-10-27 MED ORDER — AMPHETAMINE-DEXTROAMPHETAMINE 20 MG PO TABS
20.0000 mg | ORAL_TABLET | Freq: Two times a day (BID) | ORAL | 0 refills | Status: DC
  Filled 2022-11-02 (×2): qty 60, 30d supply, fill #0

## 2022-11-02 ENCOUNTER — Other Ambulatory Visit: Payer: Self-pay

## 2022-11-02 ENCOUNTER — Other Ambulatory Visit (HOSPITAL_COMMUNITY): Payer: Self-pay

## 2022-11-03 ENCOUNTER — Other Ambulatory Visit (HOSPITAL_COMMUNITY): Payer: Self-pay

## 2022-11-03 ENCOUNTER — Encounter (HOSPITAL_COMMUNITY): Payer: Self-pay

## 2022-11-03 ENCOUNTER — Other Ambulatory Visit: Payer: Self-pay

## 2022-11-12 ENCOUNTER — Other Ambulatory Visit: Payer: Self-pay

## 2022-11-30 ENCOUNTER — Other Ambulatory Visit (HOSPITAL_COMMUNITY): Payer: Self-pay

## 2022-11-30 MED ORDER — IPRATROPIUM BROMIDE 0.06 % NA SOLN
2.0000 | Freq: Three times a day (TID) | NASAL | 0 refills | Status: DC | PRN
  Filled 2022-11-30 – 2022-12-01 (×2): qty 15, 25d supply, fill #0

## 2022-12-01 ENCOUNTER — Other Ambulatory Visit (HOSPITAL_COMMUNITY): Payer: Self-pay

## 2022-12-01 MED ORDER — AMPHETAMINE-DEXTROAMPHETAMINE 20 MG PO TABS
20.0000 mg | ORAL_TABLET | Freq: Two times a day (BID) | ORAL | 0 refills | Status: DC
  Filled 2022-12-02: qty 60, 30d supply, fill #0

## 2022-12-02 ENCOUNTER — Other Ambulatory Visit (HOSPITAL_COMMUNITY): Payer: Self-pay

## 2022-12-02 ENCOUNTER — Other Ambulatory Visit: Payer: Self-pay

## 2022-12-07 ENCOUNTER — Other Ambulatory Visit (HOSPITAL_COMMUNITY): Payer: Self-pay

## 2022-12-07 ENCOUNTER — Other Ambulatory Visit (HOSPITAL_BASED_OUTPATIENT_CLINIC_OR_DEPARTMENT_OTHER): Payer: Self-pay

## 2022-12-07 DIAGNOSIS — J309 Allergic rhinitis, unspecified: Secondary | ICD-10-CM | POA: Diagnosis not present

## 2022-12-07 DIAGNOSIS — R030 Elevated blood-pressure reading, without diagnosis of hypertension: Secondary | ICD-10-CM | POA: Diagnosis not present

## 2022-12-07 MED ORDER — AZELASTINE-FLUTICASONE 137-50 MCG/ACT NA SUSP
1.0000 | Freq: Two times a day (BID) | NASAL | 11 refills | Status: AC
  Filled 2022-12-07 – 2023-02-24 (×6): qty 23, 30d supply, fill #0

## 2022-12-07 MED ORDER — MONTELUKAST SODIUM 10 MG PO TABS
10.0000 mg | ORAL_TABLET | Freq: Every evening | ORAL | 3 refills | Status: DC
  Filled 2022-12-07: qty 90, 90d supply, fill #0
  Filled 2023-01-21 – 2023-04-23 (×2): qty 90, 90d supply, fill #1
  Filled 2023-08-24 – 2023-09-06 (×2): qty 90, 90d supply, fill #2

## 2022-12-14 ENCOUNTER — Other Ambulatory Visit (HOSPITAL_COMMUNITY): Payer: Self-pay

## 2022-12-15 ENCOUNTER — Other Ambulatory Visit (HOSPITAL_COMMUNITY): Payer: Self-pay

## 2022-12-15 ENCOUNTER — Other Ambulatory Visit: Payer: Self-pay

## 2022-12-16 ENCOUNTER — Other Ambulatory Visit (HOSPITAL_COMMUNITY): Payer: Self-pay

## 2022-12-16 ENCOUNTER — Other Ambulatory Visit: Payer: Self-pay

## 2022-12-16 MED ORDER — LORAZEPAM 1 MG PO TABS
1.0000 mg | ORAL_TABLET | Freq: Every day | ORAL | 0 refills | Status: DC | PRN
  Filled 2022-12-16: qty 30, 30d supply, fill #0

## 2022-12-17 ENCOUNTER — Other Ambulatory Visit: Payer: Self-pay

## 2022-12-23 ENCOUNTER — Other Ambulatory Visit (HOSPITAL_COMMUNITY): Payer: Self-pay

## 2022-12-30 ENCOUNTER — Other Ambulatory Visit: Payer: Self-pay

## 2022-12-30 ENCOUNTER — Other Ambulatory Visit (HOSPITAL_COMMUNITY): Payer: Self-pay

## 2022-12-31 ENCOUNTER — Other Ambulatory Visit (HOSPITAL_COMMUNITY): Payer: Self-pay

## 2022-12-31 MED ORDER — AMPHETAMINE-DEXTROAMPHETAMINE 20 MG PO TABS
20.0000 mg | ORAL_TABLET | Freq: Two times a day (BID) | ORAL | 0 refills | Status: DC
  Filled 2023-01-04: qty 60, 30d supply, fill #0

## 2023-01-04 ENCOUNTER — Other Ambulatory Visit: Payer: Self-pay

## 2023-01-04 ENCOUNTER — Other Ambulatory Visit (HOSPITAL_BASED_OUTPATIENT_CLINIC_OR_DEPARTMENT_OTHER): Payer: Self-pay

## 2023-01-04 MED ORDER — TRIAMCINOLONE ACETONIDE 55 MCG/ACT NA AERO
2.0000 | INHALATION_SPRAY | Freq: Every day | NASAL | 4 refills | Status: DC
  Filled 2023-01-04: qty 16.9, 30d supply, fill #0
  Filled 2023-02-04 – 2023-07-09 (×5): qty 16.9, 30d supply, fill #1
  Filled 2023-08-24: qty 16.9, 30d supply, fill #2

## 2023-01-04 MED ORDER — AZELASTINE HCL 0.1 % NA SOLN
1.0000 | Freq: Every day | NASAL | 4 refills | Status: DC
  Filled 2023-01-04 (×2): qty 90, 90d supply, fill #0
  Filled 2023-04-23: qty 90, 90d supply, fill #1
  Filled 2023-08-24: qty 90, 90d supply, fill #2

## 2023-01-05 ENCOUNTER — Other Ambulatory Visit: Payer: Self-pay

## 2023-01-05 ENCOUNTER — Other Ambulatory Visit (HOSPITAL_BASED_OUTPATIENT_CLINIC_OR_DEPARTMENT_OTHER): Payer: Self-pay

## 2023-01-21 ENCOUNTER — Other Ambulatory Visit (HOSPITAL_COMMUNITY): Payer: Self-pay

## 2023-01-22 ENCOUNTER — Other Ambulatory Visit: Payer: Self-pay

## 2023-01-22 ENCOUNTER — Other Ambulatory Visit (HOSPITAL_COMMUNITY): Payer: Self-pay

## 2023-01-22 MED ORDER — TRINTELLIX 20 MG PO TABS
20.0000 mg | ORAL_TABLET | Freq: Every day | ORAL | 1 refills | Status: DC
  Filled 2023-01-22: qty 30, 30d supply, fill #0
  Filled 2023-02-24: qty 30, 30d supply, fill #1

## 2023-01-22 MED ORDER — LORAZEPAM 1 MG PO TABS
1.0000 mg | ORAL_TABLET | Freq: Every day | ORAL | 0 refills | Status: DC | PRN
  Filled 2023-01-22: qty 30, 30d supply, fill #0

## 2023-01-23 ENCOUNTER — Other Ambulatory Visit (HOSPITAL_COMMUNITY): Payer: Self-pay

## 2023-01-29 ENCOUNTER — Other Ambulatory Visit (HOSPITAL_COMMUNITY): Payer: Self-pay

## 2023-01-29 MED ORDER — AMPHETAMINE-DEXTROAMPHETAMINE 20 MG PO TABS
20.0000 mg | ORAL_TABLET | Freq: Two times a day (BID) | ORAL | 0 refills | Status: DC
  Filled 2023-02-04: qty 60, 30d supply, fill #0

## 2023-02-04 ENCOUNTER — Other Ambulatory Visit: Payer: Self-pay

## 2023-02-04 ENCOUNTER — Other Ambulatory Visit (HOSPITAL_COMMUNITY): Payer: Self-pay

## 2023-02-24 ENCOUNTER — Other Ambulatory Visit (HOSPITAL_COMMUNITY): Payer: Self-pay

## 2023-02-24 ENCOUNTER — Other Ambulatory Visit: Payer: Self-pay

## 2023-02-25 ENCOUNTER — Other Ambulatory Visit (HOSPITAL_COMMUNITY): Payer: Self-pay

## 2023-02-25 MED ORDER — LORAZEPAM 1 MG PO TABS
1.0000 mg | ORAL_TABLET | Freq: Every day | ORAL | 0 refills | Status: DC | PRN
  Filled 2023-02-25: qty 30, 30d supply, fill #0

## 2023-03-02 DIAGNOSIS — Z114 Encounter for screening for human immunodeficiency virus [HIV]: Secondary | ICD-10-CM | POA: Diagnosis not present

## 2023-03-02 DIAGNOSIS — Z13220 Encounter for screening for lipoid disorders: Secondary | ICD-10-CM | POA: Diagnosis not present

## 2023-03-02 DIAGNOSIS — Z0000 Encounter for general adult medical examination without abnormal findings: Secondary | ICD-10-CM | POA: Diagnosis not present

## 2023-03-03 ENCOUNTER — Other Ambulatory Visit (HOSPITAL_COMMUNITY): Payer: Self-pay

## 2023-03-04 ENCOUNTER — Other Ambulatory Visit (HOSPITAL_COMMUNITY): Payer: Self-pay

## 2023-03-04 MED ORDER — AMPHETAMINE-DEXTROAMPHETAMINE 20 MG PO TABS
20.0000 mg | ORAL_TABLET | Freq: Two times a day (BID) | ORAL | 0 refills | Status: DC
  Filled 2023-03-08: qty 60, 30d supply, fill #0

## 2023-03-08 ENCOUNTER — Other Ambulatory Visit: Payer: Self-pay

## 2023-03-08 ENCOUNTER — Other Ambulatory Visit (HOSPITAL_BASED_OUTPATIENT_CLINIC_OR_DEPARTMENT_OTHER): Payer: Self-pay

## 2023-03-09 ENCOUNTER — Other Ambulatory Visit (HOSPITAL_BASED_OUTPATIENT_CLINIC_OR_DEPARTMENT_OTHER): Payer: Self-pay

## 2023-03-09 DIAGNOSIS — F4310 Post-traumatic stress disorder, unspecified: Secondary | ICD-10-CM | POA: Diagnosis not present

## 2023-03-09 DIAGNOSIS — F5101 Primary insomnia: Secondary | ICD-10-CM | POA: Diagnosis not present

## 2023-03-09 DIAGNOSIS — F3342 Major depressive disorder, recurrent, in full remission: Secondary | ICD-10-CM | POA: Diagnosis not present

## 2023-03-09 DIAGNOSIS — F900 Attention-deficit hyperactivity disorder, predominantly inattentive type: Secondary | ICD-10-CM | POA: Diagnosis not present

## 2023-03-10 ENCOUNTER — Other Ambulatory Visit (HOSPITAL_COMMUNITY): Payer: Self-pay

## 2023-03-10 MED ORDER — TRINTELLIX 20 MG PO TABS
20.0000 mg | ORAL_TABLET | Freq: Every day | ORAL | 12 refills | Status: DC
  Filled 2023-03-10 – 2023-03-19 (×3): qty 30, 30d supply, fill #0
  Filled 2023-04-23: qty 30, 30d supply, fill #1
  Filled 2023-05-22: qty 30, 30d supply, fill #2
  Filled 2023-06-14 – 2023-06-18 (×2): qty 30, 30d supply, fill #3
  Filled 2023-07-08 – 2023-07-30 (×4): qty 30, 30d supply, fill #0
  Filled 2023-08-24 – 2023-09-06 (×2): qty 30, 30d supply, fill #1
  Filled 2023-10-07: qty 30, 30d supply, fill #2
  Filled 2023-11-11: qty 30, 30d supply, fill #3
  Filled 2023-12-10 – 2023-12-16 (×2): qty 30, 30d supply, fill #4
  Filled 2024-01-08: qty 30, 30d supply, fill #5
  Filled 2024-02-10: qty 30, 30d supply, fill #6
  Filled ????-??-??: fill #0

## 2023-03-12 DIAGNOSIS — Z0000 Encounter for general adult medical examination without abnormal findings: Secondary | ICD-10-CM | POA: Diagnosis not present

## 2023-03-12 DIAGNOSIS — R030 Elevated blood-pressure reading, without diagnosis of hypertension: Secondary | ICD-10-CM | POA: Diagnosis not present

## 2023-03-12 DIAGNOSIS — Z23 Encounter for immunization: Secondary | ICD-10-CM | POA: Diagnosis not present

## 2023-03-15 ENCOUNTER — Other Ambulatory Visit: Payer: Self-pay | Admitting: Family Medicine

## 2023-03-15 DIAGNOSIS — Z0000 Encounter for general adult medical examination without abnormal findings: Secondary | ICD-10-CM

## 2023-03-17 ENCOUNTER — Other Ambulatory Visit (HOSPITAL_COMMUNITY): Payer: Self-pay

## 2023-03-19 ENCOUNTER — Other Ambulatory Visit (HOSPITAL_COMMUNITY): Payer: Self-pay

## 2023-04-06 ENCOUNTER — Other Ambulatory Visit (HOSPITAL_COMMUNITY): Payer: Self-pay

## 2023-04-06 DIAGNOSIS — Z309 Encounter for contraceptive management, unspecified: Secondary | ICD-10-CM | POA: Diagnosis not present

## 2023-04-06 DIAGNOSIS — N951 Menopausal and female climacteric states: Secondary | ICD-10-CM | POA: Diagnosis not present

## 2023-04-07 ENCOUNTER — Ambulatory Visit
Admission: RE | Admit: 2023-04-07 | Discharge: 2023-04-07 | Disposition: A | Discharge status: Home / Self Care | Payer: BC Managed Care – PPO | Source: Ambulatory Visit | Attending: Family Medicine | Admitting: Family Medicine

## 2023-04-07 ENCOUNTER — Other Ambulatory Visit (HOSPITAL_COMMUNITY): Payer: Self-pay

## 2023-04-07 ENCOUNTER — Other Ambulatory Visit: Payer: Self-pay

## 2023-04-07 DIAGNOSIS — Z1231 Encounter for screening mammogram for malignant neoplasm of breast: Secondary | ICD-10-CM | POA: Diagnosis not present

## 2023-04-07 DIAGNOSIS — Z0000 Encounter for general adult medical examination without abnormal findings: Secondary | ICD-10-CM

## 2023-04-07 MED ORDER — IPRATROPIUM BROMIDE 0.06 % NA SOLN
2.0000 | Freq: Three times a day (TID) | NASAL | 2 refills | Status: DC
  Filled 2023-04-07: qty 15, 25d supply, fill #0
  Filled 2023-04-27: qty 15, 25d supply, fill #1
  Filled 2023-05-22: qty 15, 25d supply, fill #2

## 2023-04-07 MED ORDER — AMPHETAMINE-DEXTROAMPHETAMINE 20 MG PO TABS
20.0000 mg | ORAL_TABLET | Freq: Two times a day (BID) | ORAL | 0 refills | Status: DC
  Filled 2023-04-07: qty 60, 30d supply, fill #0

## 2023-04-16 ENCOUNTER — Other Ambulatory Visit (HOSPITAL_COMMUNITY): Payer: Self-pay

## 2023-04-23 ENCOUNTER — Other Ambulatory Visit (HOSPITAL_COMMUNITY): Payer: Self-pay

## 2023-04-23 ENCOUNTER — Other Ambulatory Visit: Payer: Self-pay

## 2023-04-24 ENCOUNTER — Other Ambulatory Visit (HOSPITAL_COMMUNITY): Payer: Self-pay

## 2023-04-24 MED ORDER — LORAZEPAM 1 MG PO TABS
1.0000 mg | ORAL_TABLET | Freq: Every day | ORAL | 2 refills | Status: AC | PRN
  Filled 2023-04-24: qty 30, 30d supply, fill #0
  Filled 2023-04-27: qty 30, 30d supply, fill #1
  Filled 2023-07-08: qty 30, 30d supply, fill #0
  Filled 2023-09-27: qty 30, 30d supply, fill #1

## 2023-04-26 ENCOUNTER — Other Ambulatory Visit: Payer: Self-pay

## 2023-04-27 ENCOUNTER — Other Ambulatory Visit: Payer: Self-pay

## 2023-05-04 ENCOUNTER — Other Ambulatory Visit (HOSPITAL_COMMUNITY): Payer: Self-pay

## 2023-05-05 ENCOUNTER — Other Ambulatory Visit (HOSPITAL_COMMUNITY): Payer: Self-pay

## 2023-05-05 MED ORDER — AMPHETAMINE-DEXTROAMPHETAMINE 20 MG PO TABS
20.0000 mg | ORAL_TABLET | Freq: Two times a day (BID) | ORAL | 0 refills | Status: DC
  Filled 2023-05-10: qty 60, 30d supply, fill #0

## 2023-05-10 ENCOUNTER — Other Ambulatory Visit (HOSPITAL_COMMUNITY): Payer: Self-pay

## 2023-05-10 ENCOUNTER — Other Ambulatory Visit (HOSPITAL_BASED_OUTPATIENT_CLINIC_OR_DEPARTMENT_OTHER): Payer: Self-pay

## 2023-05-11 ENCOUNTER — Other Ambulatory Visit (HOSPITAL_BASED_OUTPATIENT_CLINIC_OR_DEPARTMENT_OTHER): Payer: Self-pay

## 2023-05-18 DIAGNOSIS — Z309 Encounter for contraceptive management, unspecified: Secondary | ICD-10-CM | POA: Diagnosis not present

## 2023-05-24 ENCOUNTER — Other Ambulatory Visit: Payer: Self-pay

## 2023-06-04 ENCOUNTER — Other Ambulatory Visit (HOSPITAL_COMMUNITY): Payer: Self-pay

## 2023-06-07 ENCOUNTER — Other Ambulatory Visit (HOSPITAL_BASED_OUTPATIENT_CLINIC_OR_DEPARTMENT_OTHER): Payer: Self-pay

## 2023-06-07 DIAGNOSIS — Z1151 Encounter for screening for human papillomavirus (HPV): Secondary | ICD-10-CM | POA: Diagnosis not present

## 2023-06-07 DIAGNOSIS — Z124 Encounter for screening for malignant neoplasm of cervix: Secondary | ICD-10-CM | POA: Diagnosis not present

## 2023-06-07 DIAGNOSIS — N760 Acute vaginitis: Secondary | ICD-10-CM | POA: Diagnosis not present

## 2023-06-07 DIAGNOSIS — Z3202 Encounter for pregnancy test, result negative: Secondary | ICD-10-CM | POA: Diagnosis not present

## 2023-06-07 DIAGNOSIS — Z30430 Encounter for insertion of intrauterine contraceptive device: Secondary | ICD-10-CM | POA: Diagnosis not present

## 2023-06-07 MED ORDER — AMPHETAMINE-DEXTROAMPHETAMINE 20 MG PO TABS
20.0000 mg | ORAL_TABLET | Freq: Two times a day (BID) | ORAL | 0 refills | Status: DC
  Filled 2023-06-10: qty 60, 30d supply, fill #0

## 2023-06-07 MED ORDER — METRONIDAZOLE 500 MG PO TABS
500.0000 mg | ORAL_TABLET | Freq: Two times a day (BID) | ORAL | 0 refills | Status: DC
  Filled 2023-06-07: qty 14, 7d supply, fill #0

## 2023-06-10 ENCOUNTER — Other Ambulatory Visit: Payer: Self-pay

## 2023-06-10 ENCOUNTER — Other Ambulatory Visit (HOSPITAL_BASED_OUTPATIENT_CLINIC_OR_DEPARTMENT_OTHER): Payer: Self-pay

## 2023-06-14 ENCOUNTER — Other Ambulatory Visit (HOSPITAL_COMMUNITY): Payer: Self-pay

## 2023-06-14 ENCOUNTER — Other Ambulatory Visit: Payer: Self-pay

## 2023-06-15 ENCOUNTER — Other Ambulatory Visit: Payer: Self-pay

## 2023-07-08 ENCOUNTER — Other Ambulatory Visit (HOSPITAL_BASED_OUTPATIENT_CLINIC_OR_DEPARTMENT_OTHER): Payer: Self-pay

## 2023-07-08 ENCOUNTER — Other Ambulatory Visit (HOSPITAL_COMMUNITY): Payer: Self-pay

## 2023-07-08 MED ORDER — AMPHETAMINE-DEXTROAMPHETAMINE 20 MG PO TABS
20.0000 mg | ORAL_TABLET | Freq: Two times a day (BID) | ORAL | 0 refills | Status: DC
  Filled 2023-07-13: qty 60, 30d supply, fill #0

## 2023-07-09 ENCOUNTER — Other Ambulatory Visit (HOSPITAL_BASED_OUTPATIENT_CLINIC_OR_DEPARTMENT_OTHER): Payer: Self-pay

## 2023-07-13 ENCOUNTER — Other Ambulatory Visit (HOSPITAL_BASED_OUTPATIENT_CLINIC_OR_DEPARTMENT_OTHER): Payer: Self-pay

## 2023-07-17 ENCOUNTER — Other Ambulatory Visit (HOSPITAL_BASED_OUTPATIENT_CLINIC_OR_DEPARTMENT_OTHER): Payer: Self-pay

## 2023-07-28 DIAGNOSIS — Z30431 Encounter for routine checking of intrauterine contraceptive device: Secondary | ICD-10-CM | POA: Diagnosis not present

## 2023-07-29 ENCOUNTER — Other Ambulatory Visit (HOSPITAL_BASED_OUTPATIENT_CLINIC_OR_DEPARTMENT_OTHER): Payer: Self-pay

## 2023-07-30 ENCOUNTER — Other Ambulatory Visit: Payer: Self-pay

## 2023-07-30 ENCOUNTER — Other Ambulatory Visit (HOSPITAL_COMMUNITY): Payer: Self-pay

## 2023-08-12 ENCOUNTER — Other Ambulatory Visit (HOSPITAL_BASED_OUTPATIENT_CLINIC_OR_DEPARTMENT_OTHER): Payer: Self-pay

## 2023-08-12 MED ORDER — AMPHETAMINE-DEXTROAMPHETAMINE 20 MG PO TABS
20.0000 mg | ORAL_TABLET | Freq: Two times a day (BID) | ORAL | 0 refills | Status: AC
  Filled 2023-08-12: qty 60, 30d supply, fill #0

## 2023-08-24 ENCOUNTER — Other Ambulatory Visit: Payer: Self-pay

## 2023-08-24 ENCOUNTER — Encounter: Payer: Self-pay | Admitting: Pharmacist

## 2023-08-26 ENCOUNTER — Other Ambulatory Visit: Payer: Self-pay

## 2023-08-27 ENCOUNTER — Other Ambulatory Visit: Payer: Self-pay

## 2023-09-06 ENCOUNTER — Other Ambulatory Visit (HOSPITAL_COMMUNITY): Payer: Self-pay

## 2023-09-06 ENCOUNTER — Other Ambulatory Visit: Payer: Self-pay

## 2023-09-07 DIAGNOSIS — F5101 Primary insomnia: Secondary | ICD-10-CM | POA: Diagnosis not present

## 2023-09-07 DIAGNOSIS — F331 Major depressive disorder, recurrent, moderate: Secondary | ICD-10-CM | POA: Diagnosis not present

## 2023-09-07 DIAGNOSIS — F900 Attention-deficit hyperactivity disorder, predominantly inattentive type: Secondary | ICD-10-CM | POA: Diagnosis not present

## 2023-09-07 DIAGNOSIS — F4310 Post-traumatic stress disorder, unspecified: Secondary | ICD-10-CM | POA: Diagnosis not present

## 2023-09-08 ENCOUNTER — Other Ambulatory Visit (HOSPITAL_BASED_OUTPATIENT_CLINIC_OR_DEPARTMENT_OTHER): Payer: Self-pay

## 2023-09-08 MED ORDER — AMPHETAMINE-DEXTROAMPHETAMINE 20 MG PO TABS
20.0000 mg | ORAL_TABLET | Freq: Two times a day (BID) | ORAL | 0 refills | Status: DC
  Filled 2023-09-11: qty 60, 30d supply, fill #0

## 2023-09-11 ENCOUNTER — Other Ambulatory Visit (HOSPITAL_BASED_OUTPATIENT_CLINIC_OR_DEPARTMENT_OTHER): Payer: Self-pay

## 2023-09-14 DIAGNOSIS — F332 Major depressive disorder, recurrent severe without psychotic features: Secondary | ICD-10-CM | POA: Diagnosis not present

## 2023-09-17 DIAGNOSIS — F332 Major depressive disorder, recurrent severe without psychotic features: Secondary | ICD-10-CM | POA: Diagnosis not present

## 2023-09-21 DIAGNOSIS — F332 Major depressive disorder, recurrent severe without psychotic features: Secondary | ICD-10-CM | POA: Diagnosis not present

## 2023-09-23 DIAGNOSIS — F332 Major depressive disorder, recurrent severe without psychotic features: Secondary | ICD-10-CM | POA: Diagnosis not present

## 2023-09-27 ENCOUNTER — Other Ambulatory Visit (HOSPITAL_BASED_OUTPATIENT_CLINIC_OR_DEPARTMENT_OTHER): Payer: Self-pay

## 2023-09-28 DIAGNOSIS — F332 Major depressive disorder, recurrent severe without psychotic features: Secondary | ICD-10-CM | POA: Diagnosis not present

## 2023-09-30 DIAGNOSIS — F332 Major depressive disorder, recurrent severe without psychotic features: Secondary | ICD-10-CM | POA: Diagnosis not present

## 2023-10-05 ENCOUNTER — Other Ambulatory Visit (HOSPITAL_COMMUNITY): Payer: Self-pay

## 2023-10-05 DIAGNOSIS — F332 Major depressive disorder, recurrent severe without psychotic features: Secondary | ICD-10-CM | POA: Diagnosis not present

## 2023-10-07 ENCOUNTER — Other Ambulatory Visit (HOSPITAL_BASED_OUTPATIENT_CLINIC_OR_DEPARTMENT_OTHER): Payer: Self-pay

## 2023-10-07 ENCOUNTER — Other Ambulatory Visit (HOSPITAL_COMMUNITY): Payer: Self-pay

## 2023-10-07 DIAGNOSIS — F332 Major depressive disorder, recurrent severe without psychotic features: Secondary | ICD-10-CM | POA: Diagnosis not present

## 2023-10-13 DIAGNOSIS — F332 Major depressive disorder, recurrent severe without psychotic features: Secondary | ICD-10-CM | POA: Diagnosis not present

## 2023-10-17 ENCOUNTER — Other Ambulatory Visit (HOSPITAL_BASED_OUTPATIENT_CLINIC_OR_DEPARTMENT_OTHER): Payer: Self-pay

## 2023-10-18 ENCOUNTER — Other Ambulatory Visit (HOSPITAL_BASED_OUTPATIENT_CLINIC_OR_DEPARTMENT_OTHER): Payer: Self-pay

## 2023-10-18 DIAGNOSIS — F3341 Major depressive disorder, recurrent, in partial remission: Secondary | ICD-10-CM | POA: Diagnosis not present

## 2023-10-18 DIAGNOSIS — F4310 Post-traumatic stress disorder, unspecified: Secondary | ICD-10-CM | POA: Diagnosis not present

## 2023-10-18 DIAGNOSIS — F900 Attention-deficit hyperactivity disorder, predominantly inattentive type: Secondary | ICD-10-CM | POA: Diagnosis not present

## 2023-10-18 MED ORDER — AMPHETAMINE-DEXTROAMPHETAMINE 20 MG PO TABS
20.0000 mg | ORAL_TABLET | Freq: Two times a day (BID) | ORAL | 0 refills | Status: DC
  Filled 2023-10-18: qty 60, 30d supply, fill #0

## 2023-10-21 DIAGNOSIS — F332 Major depressive disorder, recurrent severe without psychotic features: Secondary | ICD-10-CM | POA: Diagnosis not present

## 2023-10-29 DIAGNOSIS — F332 Major depressive disorder, recurrent severe without psychotic features: Secondary | ICD-10-CM | POA: Diagnosis not present

## 2023-11-05 DIAGNOSIS — F332 Major depressive disorder, recurrent severe without psychotic features: Secondary | ICD-10-CM | POA: Diagnosis not present

## 2023-11-11 ENCOUNTER — Other Ambulatory Visit (HOSPITAL_BASED_OUTPATIENT_CLINIC_OR_DEPARTMENT_OTHER): Payer: Self-pay

## 2023-11-12 ENCOUNTER — Other Ambulatory Visit (HOSPITAL_BASED_OUTPATIENT_CLINIC_OR_DEPARTMENT_OTHER): Payer: Self-pay

## 2023-11-12 MED ORDER — AMPHETAMINE-DEXTROAMPHETAMINE 20 MG PO TABS
20.0000 mg | ORAL_TABLET | Freq: Two times a day (BID) | ORAL | 0 refills | Status: DC
  Filled 2023-11-19: qty 60, 30d supply, fill #0

## 2023-11-15 ENCOUNTER — Other Ambulatory Visit (HOSPITAL_BASED_OUTPATIENT_CLINIC_OR_DEPARTMENT_OTHER): Payer: Self-pay

## 2023-11-18 DIAGNOSIS — F332 Major depressive disorder, recurrent severe without psychotic features: Secondary | ICD-10-CM | POA: Diagnosis not present

## 2023-11-19 ENCOUNTER — Other Ambulatory Visit (HOSPITAL_BASED_OUTPATIENT_CLINIC_OR_DEPARTMENT_OTHER): Payer: Self-pay

## 2023-11-26 DIAGNOSIS — F332 Major depressive disorder, recurrent severe without psychotic features: Secondary | ICD-10-CM | POA: Diagnosis not present

## 2023-12-03 DIAGNOSIS — F332 Major depressive disorder, recurrent severe without psychotic features: Secondary | ICD-10-CM | POA: Diagnosis not present

## 2023-12-07 ENCOUNTER — Other Ambulatory Visit (HOSPITAL_BASED_OUTPATIENT_CLINIC_OR_DEPARTMENT_OTHER): Payer: Self-pay

## 2023-12-07 DIAGNOSIS — I10 Essential (primary) hypertension: Secondary | ICD-10-CM | POA: Diagnosis not present

## 2023-12-07 MED ORDER — PROPRANOLOL HCL ER 60 MG PO CP24
60.0000 mg | ORAL_CAPSULE | Freq: Every day | ORAL | 1 refills | Status: DC
  Filled 2023-12-07: qty 30, 30d supply, fill #0
  Filled 2024-01-08: qty 30, 30d supply, fill #1

## 2023-12-09 DIAGNOSIS — F332 Major depressive disorder, recurrent severe without psychotic features: Secondary | ICD-10-CM | POA: Diagnosis not present

## 2023-12-10 ENCOUNTER — Other Ambulatory Visit: Payer: Self-pay

## 2023-12-10 ENCOUNTER — Other Ambulatory Visit (HOSPITAL_COMMUNITY): Payer: Self-pay

## 2023-12-10 ENCOUNTER — Encounter: Payer: Self-pay | Admitting: Pharmacist

## 2023-12-15 ENCOUNTER — Other Ambulatory Visit: Payer: Self-pay

## 2023-12-16 ENCOUNTER — Other Ambulatory Visit (HOSPITAL_BASED_OUTPATIENT_CLINIC_OR_DEPARTMENT_OTHER): Payer: Self-pay

## 2023-12-16 DIAGNOSIS — F332 Major depressive disorder, recurrent severe without psychotic features: Secondary | ICD-10-CM | POA: Diagnosis not present

## 2023-12-17 ENCOUNTER — Other Ambulatory Visit (HOSPITAL_COMMUNITY): Payer: Self-pay

## 2023-12-20 ENCOUNTER — Other Ambulatory Visit (HOSPITAL_BASED_OUTPATIENT_CLINIC_OR_DEPARTMENT_OTHER): Payer: Self-pay

## 2023-12-20 MED ORDER — AMPHETAMINE-DEXTROAMPHETAMINE 20 MG PO TABS
20.0000 mg | ORAL_TABLET | Freq: Two times a day (BID) | ORAL | 0 refills | Status: DC
  Filled 2023-12-20 – 2023-12-21 (×2): qty 60, 30d supply, fill #0

## 2023-12-21 ENCOUNTER — Other Ambulatory Visit: Payer: Self-pay

## 2023-12-23 DIAGNOSIS — F332 Major depressive disorder, recurrent severe without psychotic features: Secondary | ICD-10-CM | POA: Diagnosis not present

## 2023-12-29 DIAGNOSIS — F332 Major depressive disorder, recurrent severe without psychotic features: Secondary | ICD-10-CM | POA: Diagnosis not present

## 2024-01-08 ENCOUNTER — Other Ambulatory Visit (HOSPITAL_COMMUNITY): Payer: Self-pay

## 2024-01-10 ENCOUNTER — Other Ambulatory Visit: Payer: Self-pay

## 2024-01-10 MED ORDER — AZELASTINE HCL 0.1 % NA SOLN
1.0000 | Freq: Every day | NASAL | 4 refills | Status: AC
  Filled 2024-01-10: qty 90, 90d supply, fill #0
  Filled 2024-06-10: qty 90, 90d supply, fill #1
  Filled 2024-07-18 – 2024-12-11 (×2): qty 90, 90d supply, fill #2

## 2024-01-10 MED ORDER — MONTELUKAST SODIUM 10 MG PO TABS
10.0000 mg | ORAL_TABLET | Freq: Every evening | ORAL | 3 refills | Status: AC
  Filled 2024-01-10: qty 90, 90d supply, fill #0
  Filled 2024-04-11: qty 90, 90d supply, fill #1
  Filled 2024-07-12: qty 90, 90d supply, fill #2
  Filled 2024-09-04 – 2024-10-14 (×2): qty 90, 90d supply, fill #3

## 2024-01-10 MED ORDER — TRIAMCINOLONE ACETONIDE 55 MCG/ACT NA AERO
2.0000 | INHALATION_SPRAY | Freq: Every day | NASAL | 4 refills | Status: AC
  Filled 2024-01-10: qty 16.9, 30d supply, fill #0
  Filled 2024-02-09 – 2024-02-17 (×2): qty 16.9, 30d supply, fill #1
  Filled 2024-06-10: qty 16.9, 30d supply, fill #2
  Filled 2024-07-18: qty 16.9, 30d supply, fill #3
  Filled 2024-12-11: qty 16.9, 30d supply, fill #4

## 2024-01-11 ENCOUNTER — Other Ambulatory Visit (HOSPITAL_COMMUNITY): Payer: Self-pay

## 2024-01-11 ENCOUNTER — Other Ambulatory Visit: Payer: Self-pay

## 2024-01-12 DIAGNOSIS — F332 Major depressive disorder, recurrent severe without psychotic features: Secondary | ICD-10-CM | POA: Diagnosis not present

## 2024-01-18 ENCOUNTER — Other Ambulatory Visit (HOSPITAL_BASED_OUTPATIENT_CLINIC_OR_DEPARTMENT_OTHER): Payer: Self-pay

## 2024-01-18 MED ORDER — AMPHETAMINE-DEXTROAMPHETAMINE 20 MG PO TABS
20.0000 mg | ORAL_TABLET | Freq: Two times a day (BID) | ORAL | 0 refills | Status: DC
  Filled 2024-01-20: qty 60, 30d supply, fill #0

## 2024-01-20 ENCOUNTER — Other Ambulatory Visit (HOSPITAL_BASED_OUTPATIENT_CLINIC_OR_DEPARTMENT_OTHER): Payer: Self-pay

## 2024-01-26 ENCOUNTER — Other Ambulatory Visit (HOSPITAL_COMMUNITY): Payer: Self-pay

## 2024-01-26 DIAGNOSIS — F332 Major depressive disorder, recurrent severe without psychotic features: Secondary | ICD-10-CM | POA: Diagnosis not present

## 2024-02-09 ENCOUNTER — Other Ambulatory Visit (HOSPITAL_COMMUNITY): Payer: Self-pay

## 2024-02-10 ENCOUNTER — Other Ambulatory Visit: Payer: Self-pay

## 2024-02-10 ENCOUNTER — Encounter: Payer: Self-pay | Admitting: Pharmacist

## 2024-02-10 ENCOUNTER — Other Ambulatory Visit (HOSPITAL_COMMUNITY): Payer: Self-pay

## 2024-02-15 ENCOUNTER — Other Ambulatory Visit: Payer: Self-pay

## 2024-02-16 ENCOUNTER — Other Ambulatory Visit (HOSPITAL_BASED_OUTPATIENT_CLINIC_OR_DEPARTMENT_OTHER): Payer: Self-pay

## 2024-02-16 MED ORDER — AMPHETAMINE-DEXTROAMPHETAMINE 20 MG PO TABS
20.0000 mg | ORAL_TABLET | Freq: Two times a day (BID) | ORAL | 0 refills | Status: DC
  Filled 2024-02-19: qty 60, 30d supply, fill #0

## 2024-02-17 ENCOUNTER — Other Ambulatory Visit (HOSPITAL_COMMUNITY): Payer: Self-pay

## 2024-02-18 ENCOUNTER — Other Ambulatory Visit (HOSPITAL_BASED_OUTPATIENT_CLINIC_OR_DEPARTMENT_OTHER): Payer: Self-pay

## 2024-02-18 MED ORDER — PROPRANOLOL HCL ER 60 MG PO CP24
60.0000 mg | ORAL_CAPSULE | Freq: Every day | ORAL | 0 refills | Status: DC
  Filled 2024-02-18: qty 30, 30d supply, fill #0

## 2024-02-19 ENCOUNTER — Other Ambulatory Visit (HOSPITAL_BASED_OUTPATIENT_CLINIC_OR_DEPARTMENT_OTHER): Payer: Self-pay

## 2024-02-24 ENCOUNTER — Other Ambulatory Visit (HOSPITAL_BASED_OUTPATIENT_CLINIC_OR_DEPARTMENT_OTHER): Payer: Self-pay

## 2024-02-24 DIAGNOSIS — J309 Allergic rhinitis, unspecified: Secondary | ICD-10-CM | POA: Diagnosis not present

## 2024-02-24 DIAGNOSIS — I10 Essential (primary) hypertension: Secondary | ICD-10-CM | POA: Diagnosis not present

## 2024-02-24 DIAGNOSIS — F411 Generalized anxiety disorder: Secondary | ICD-10-CM | POA: Diagnosis not present

## 2024-02-24 DIAGNOSIS — R232 Flushing: Secondary | ICD-10-CM | POA: Diagnosis not present

## 2024-02-24 MED ORDER — VALSARTAN-HYDROCHLOROTHIAZIDE 160-12.5 MG PO TABS
1.0000 | ORAL_TABLET | Freq: Every day | ORAL | 0 refills | Status: DC
  Filled 2024-02-24: qty 90, 90d supply, fill #0

## 2024-02-27 ENCOUNTER — Other Ambulatory Visit (HOSPITAL_COMMUNITY): Payer: Self-pay

## 2024-02-29 ENCOUNTER — Other Ambulatory Visit (HOSPITAL_COMMUNITY): Payer: Self-pay

## 2024-02-29 MED ORDER — IPRATROPIUM BROMIDE 0.06 % NA SOLN
2.0000 | Freq: Three times a day (TID) | NASAL | 2 refills | Status: DC
Start: 2024-02-29 — End: 2024-09-05
  Filled 2024-02-29: qty 15, 25d supply, fill #0
  Filled 2024-04-11: qty 15, 25d supply, fill #1
  Filled 2024-05-20: qty 15, 25d supply, fill #2

## 2024-03-06 ENCOUNTER — Ambulatory Visit: Admitting: Internal Medicine

## 2024-03-08 DIAGNOSIS — F332 Major depressive disorder, recurrent severe without psychotic features: Secondary | ICD-10-CM | POA: Diagnosis not present

## 2024-03-08 DIAGNOSIS — Z0000 Encounter for general adult medical examination without abnormal findings: Secondary | ICD-10-CM | POA: Diagnosis not present

## 2024-03-08 DIAGNOSIS — Z13220 Encounter for screening for lipoid disorders: Secondary | ICD-10-CM | POA: Diagnosis not present

## 2024-03-13 ENCOUNTER — Other Ambulatory Visit (HOSPITAL_COMMUNITY): Payer: Self-pay

## 2024-03-13 MED ORDER — TRINTELLIX 20 MG PO TABS
20.0000 mg | ORAL_TABLET | Freq: Every day | ORAL | 3 refills | Status: DC
  Filled 2024-03-13: qty 30, 30d supply, fill #0
  Filled 2024-04-11: qty 30, 30d supply, fill #1
  Filled 2024-05-08: qty 30, 30d supply, fill #2
  Filled 2024-06-10: qty 30, 30d supply, fill #3

## 2024-03-14 ENCOUNTER — Other Ambulatory Visit: Payer: Self-pay

## 2024-03-14 ENCOUNTER — Other Ambulatory Visit: Payer: Self-pay | Admitting: Family Medicine

## 2024-03-14 DIAGNOSIS — Z1231 Encounter for screening mammogram for malignant neoplasm of breast: Secondary | ICD-10-CM

## 2024-03-14 DIAGNOSIS — Z23 Encounter for immunization: Secondary | ICD-10-CM | POA: Diagnosis not present

## 2024-03-14 DIAGNOSIS — Z0000 Encounter for general adult medical examination without abnormal findings: Secondary | ICD-10-CM | POA: Diagnosis not present

## 2024-03-16 ENCOUNTER — Other Ambulatory Visit (HOSPITAL_BASED_OUTPATIENT_CLINIC_OR_DEPARTMENT_OTHER): Payer: Self-pay

## 2024-03-16 MED ORDER — AMPHETAMINE-DEXTROAMPHETAMINE 20 MG PO TABS
20.0000 mg | ORAL_TABLET | Freq: Two times a day (BID) | ORAL | 0 refills | Status: DC
  Filled 2024-03-20 (×2): qty 60, 30d supply, fill #0

## 2024-03-20 ENCOUNTER — Other Ambulatory Visit (HOSPITAL_BASED_OUTPATIENT_CLINIC_OR_DEPARTMENT_OTHER): Payer: Self-pay

## 2024-03-20 ENCOUNTER — Ambulatory Visit: Admitting: Internal Medicine

## 2024-03-20 DIAGNOSIS — I10 Essential (primary) hypertension: Secondary | ICD-10-CM | POA: Diagnosis not present

## 2024-03-20 DIAGNOSIS — E782 Mixed hyperlipidemia: Secondary | ICD-10-CM | POA: Diagnosis not present

## 2024-03-20 DIAGNOSIS — R400 Somnolence: Secondary | ICD-10-CM | POA: Diagnosis not present

## 2024-03-20 DIAGNOSIS — E781 Pure hyperglyceridemia: Secondary | ICD-10-CM | POA: Diagnosis not present

## 2024-04-07 ENCOUNTER — Ambulatory Visit

## 2024-04-11 ENCOUNTER — Other Ambulatory Visit (HOSPITAL_COMMUNITY): Payer: Self-pay

## 2024-04-18 ENCOUNTER — Other Ambulatory Visit (HOSPITAL_BASED_OUTPATIENT_CLINIC_OR_DEPARTMENT_OTHER): Payer: Self-pay

## 2024-04-18 MED ORDER — AMPHETAMINE-DEXTROAMPHETAMINE 20 MG PO TABS
20.0000 mg | ORAL_TABLET | Freq: Two times a day (BID) | ORAL | 0 refills | Status: DC
  Filled 2024-04-19: qty 60, 30d supply, fill #0

## 2024-04-19 ENCOUNTER — Other Ambulatory Visit: Payer: Self-pay | Admitting: Family Medicine

## 2024-04-19 ENCOUNTER — Other Ambulatory Visit (HOSPITAL_BASED_OUTPATIENT_CLINIC_OR_DEPARTMENT_OTHER): Payer: Self-pay

## 2024-04-19 DIAGNOSIS — Z1231 Encounter for screening mammogram for malignant neoplasm of breast: Secondary | ICD-10-CM

## 2024-04-20 ENCOUNTER — Ambulatory Visit
Admission: RE | Admit: 2024-04-20 | Discharge: 2024-04-20 | Disposition: A | Discharge status: Home / Self Care | Source: Ambulatory Visit

## 2024-04-20 DIAGNOSIS — Z1231 Encounter for screening mammogram for malignant neoplasm of breast: Secondary | ICD-10-CM | POA: Diagnosis not present

## 2024-04-24 ENCOUNTER — Encounter (INDEPENDENT_AMBULATORY_CARE_PROVIDER_SITE_OTHER): Payer: Self-pay

## 2024-04-24 ENCOUNTER — Other Ambulatory Visit: Payer: Self-pay | Admitting: Medical Genetics

## 2024-04-25 DIAGNOSIS — F3342 Major depressive disorder, recurrent, in full remission: Secondary | ICD-10-CM | POA: Diagnosis not present

## 2024-04-26 DIAGNOSIS — F332 Major depressive disorder, recurrent severe without psychotic features: Secondary | ICD-10-CM | POA: Diagnosis not present

## 2024-04-27 ENCOUNTER — Other Ambulatory Visit (HOSPITAL_COMMUNITY)
Admission: RE | Admit: 2024-04-27 | Discharge: 2024-04-27 | Disposition: A | Discharge status: Home / Self Care | Payer: Self-pay | Source: Ambulatory Visit | Attending: Medical Genetics | Admitting: Medical Genetics

## 2024-05-06 LAB — GENECONNECT MOLECULAR SCREEN: Genetic Analysis Overall Interpretation: NEGATIVE

## 2024-05-08 ENCOUNTER — Other Ambulatory Visit (HOSPITAL_COMMUNITY): Payer: Self-pay

## 2024-05-13 ENCOUNTER — Other Ambulatory Visit (HOSPITAL_COMMUNITY): Payer: Self-pay

## 2024-05-15 ENCOUNTER — Other Ambulatory Visit (HOSPITAL_BASED_OUTPATIENT_CLINIC_OR_DEPARTMENT_OTHER): Payer: Self-pay

## 2024-05-16 ENCOUNTER — Other Ambulatory Visit (HOSPITAL_BASED_OUTPATIENT_CLINIC_OR_DEPARTMENT_OTHER): Payer: Self-pay

## 2024-05-16 MED ORDER — AMPHETAMINE-DEXTROAMPHETAMINE 20 MG PO TABS
20.0000 mg | ORAL_TABLET | Freq: Two times a day (BID) | ORAL | 0 refills | Status: DC
  Filled 2024-05-18: qty 60, 30d supply, fill #0

## 2024-05-17 ENCOUNTER — Other Ambulatory Visit (HOSPITAL_COMMUNITY): Payer: Self-pay

## 2024-05-18 ENCOUNTER — Other Ambulatory Visit (HOSPITAL_BASED_OUTPATIENT_CLINIC_OR_DEPARTMENT_OTHER): Payer: Self-pay

## 2024-05-22 ENCOUNTER — Other Ambulatory Visit (HOSPITAL_COMMUNITY): Payer: Self-pay

## 2024-05-27 ENCOUNTER — Other Ambulatory Visit (HOSPITAL_BASED_OUTPATIENT_CLINIC_OR_DEPARTMENT_OTHER): Payer: Self-pay

## 2024-05-29 ENCOUNTER — Other Ambulatory Visit (HOSPITAL_BASED_OUTPATIENT_CLINIC_OR_DEPARTMENT_OTHER): Payer: Self-pay

## 2024-05-30 ENCOUNTER — Other Ambulatory Visit (HOSPITAL_BASED_OUTPATIENT_CLINIC_OR_DEPARTMENT_OTHER): Payer: Self-pay

## 2024-05-30 MED ORDER — VALSARTAN-HYDROCHLOROTHIAZIDE 160-12.5 MG PO TABS
1.0000 | ORAL_TABLET | Freq: Every day | ORAL | 0 refills | Status: DC
  Filled 2024-05-30: qty 90, 90d supply, fill #0

## 2024-06-09 DIAGNOSIS — R5383 Other fatigue: Secondary | ICD-10-CM | POA: Diagnosis not present

## 2024-06-09 DIAGNOSIS — E785 Hyperlipidemia, unspecified: Secondary | ICD-10-CM | POA: Diagnosis not present

## 2024-06-09 DIAGNOSIS — E559 Vitamin D deficiency, unspecified: Secondary | ICD-10-CM | POA: Diagnosis not present

## 2024-06-09 DIAGNOSIS — R739 Hyperglycemia, unspecified: Secondary | ICD-10-CM | POA: Diagnosis not present

## 2024-06-10 ENCOUNTER — Other Ambulatory Visit (HOSPITAL_COMMUNITY): Payer: Self-pay

## 2024-06-12 ENCOUNTER — Other Ambulatory Visit: Payer: Self-pay

## 2024-06-14 ENCOUNTER — Other Ambulatory Visit (HOSPITAL_BASED_OUTPATIENT_CLINIC_OR_DEPARTMENT_OTHER): Payer: Self-pay

## 2024-06-14 DIAGNOSIS — Z789 Other specified health status: Secondary | ICD-10-CM | POA: Diagnosis not present

## 2024-06-14 DIAGNOSIS — E782 Mixed hyperlipidemia: Secondary | ICD-10-CM | POA: Diagnosis not present

## 2024-06-14 DIAGNOSIS — E559 Vitamin D deficiency, unspecified: Secondary | ICD-10-CM | POA: Diagnosis not present

## 2024-06-14 DIAGNOSIS — I10 Essential (primary) hypertension: Secondary | ICD-10-CM | POA: Diagnosis not present

## 2024-06-14 MED ORDER — VALSARTAN-HYDROCHLOROTHIAZIDE 160-12.5 MG PO TABS
1.0000 | ORAL_TABLET | Freq: Every morning | ORAL | 3 refills | Status: AC
  Filled 2024-06-14 – 2024-09-04 (×3): qty 90, 90d supply, fill #0
  Filled 2024-12-11: qty 90, 90d supply, fill #1

## 2024-06-15 ENCOUNTER — Other Ambulatory Visit (HOSPITAL_BASED_OUTPATIENT_CLINIC_OR_DEPARTMENT_OTHER): Payer: Self-pay

## 2024-06-15 MED ORDER — AMPHETAMINE-DEXTROAMPHETAMINE 20 MG PO TABS
20.0000 mg | ORAL_TABLET | Freq: Two times a day (BID) | ORAL | 0 refills | Status: AC
  Filled 2024-06-19: qty 60, 30d supply, fill #0

## 2024-06-15 MED ORDER — AMPHETAMINE-DEXTROAMPHETAMINE 20 MG PO TABS
20.0000 mg | ORAL_TABLET | Freq: Two times a day (BID) | ORAL | 0 refills | Status: DC
  Filled 2024-08-17: qty 60, 30d supply, fill #0

## 2024-06-15 MED ORDER — AMPHETAMINE-DEXTROAMPHETAMINE 20 MG PO TABS
20.0000 mg | ORAL_TABLET | Freq: Two times a day (BID) | ORAL | 0 refills | Status: AC
  Filled 2024-07-18: qty 60, 30d supply, fill #0

## 2024-06-17 ENCOUNTER — Other Ambulatory Visit (HOSPITAL_BASED_OUTPATIENT_CLINIC_OR_DEPARTMENT_OTHER): Payer: Self-pay

## 2024-06-19 ENCOUNTER — Other Ambulatory Visit (HOSPITAL_BASED_OUTPATIENT_CLINIC_OR_DEPARTMENT_OTHER): Payer: Self-pay

## 2024-06-19 DIAGNOSIS — F332 Major depressive disorder, recurrent severe without psychotic features: Secondary | ICD-10-CM | POA: Diagnosis not present

## 2024-06-29 ENCOUNTER — Other Ambulatory Visit (HOSPITAL_COMMUNITY): Payer: Self-pay

## 2024-07-12 ENCOUNTER — Other Ambulatory Visit: Payer: Self-pay

## 2024-07-12 ENCOUNTER — Other Ambulatory Visit (HOSPITAL_COMMUNITY): Payer: Self-pay

## 2024-07-12 MED ORDER — TRINTELLIX 20 MG PO TABS
20.0000 mg | ORAL_TABLET | Freq: Every day | ORAL | 3 refills | Status: AC
  Filled 2024-07-12: qty 30, 30d supply, fill #0
  Filled 2024-08-13: qty 30, 30d supply, fill #1
  Filled 2024-09-25: qty 30, 30d supply, fill #2
  Filled 2024-11-22: qty 30, 30d supply, fill #3

## 2024-07-18 ENCOUNTER — Other Ambulatory Visit (HOSPITAL_BASED_OUTPATIENT_CLINIC_OR_DEPARTMENT_OTHER): Payer: Self-pay

## 2024-07-18 ENCOUNTER — Other Ambulatory Visit: Payer: Self-pay

## 2024-07-18 ENCOUNTER — Other Ambulatory Visit (HOSPITAL_COMMUNITY): Payer: Self-pay

## 2024-07-18 DIAGNOSIS — F332 Major depressive disorder, recurrent severe without psychotic features: Secondary | ICD-10-CM | POA: Diagnosis not present

## 2024-07-27 DIAGNOSIS — F332 Major depressive disorder, recurrent severe without psychotic features: Secondary | ICD-10-CM | POA: Diagnosis not present

## 2024-08-14 ENCOUNTER — Other Ambulatory Visit (HOSPITAL_COMMUNITY): Payer: Self-pay

## 2024-08-15 ENCOUNTER — Other Ambulatory Visit: Payer: Self-pay

## 2024-08-17 ENCOUNTER — Other Ambulatory Visit (HOSPITAL_BASED_OUTPATIENT_CLINIC_OR_DEPARTMENT_OTHER): Payer: Self-pay

## 2024-09-04 ENCOUNTER — Other Ambulatory Visit (HOSPITAL_COMMUNITY): Payer: Self-pay

## 2024-09-04 ENCOUNTER — Other Ambulatory Visit: Payer: Self-pay

## 2024-09-05 ENCOUNTER — Other Ambulatory Visit: Payer: Self-pay

## 2024-09-05 ENCOUNTER — Other Ambulatory Visit (HOSPITAL_COMMUNITY): Payer: Self-pay

## 2024-09-05 MED ORDER — IPRATROPIUM BROMIDE 0.06 % NA SOLN
2.0000 | Freq: Three times a day (TID) | NASAL | 2 refills | Status: AC
  Filled 2024-09-05: qty 15, 25d supply, fill #0
  Filled 2024-09-25: qty 15, 25d supply, fill #1
  Filled 2024-11-22: qty 15, 25d supply, fill #2

## 2024-09-12 ENCOUNTER — Other Ambulatory Visit (HOSPITAL_BASED_OUTPATIENT_CLINIC_OR_DEPARTMENT_OTHER): Payer: Self-pay

## 2024-09-12 MED ORDER — AMPHETAMINE-DEXTROAMPHETAMINE 20 MG PO TABS
20.0000 mg | ORAL_TABLET | Freq: Two times a day (BID) | ORAL | 0 refills | Status: DC
  Filled 2024-09-16: qty 60, 30d supply, fill #0

## 2024-09-16 ENCOUNTER — Other Ambulatory Visit (HOSPITAL_BASED_OUTPATIENT_CLINIC_OR_DEPARTMENT_OTHER): Payer: Self-pay

## 2024-09-25 ENCOUNTER — Other Ambulatory Visit (HOSPITAL_COMMUNITY): Payer: Self-pay

## 2024-09-25 ENCOUNTER — Other Ambulatory Visit: Payer: Self-pay

## 2024-10-14 ENCOUNTER — Other Ambulatory Visit (HOSPITAL_BASED_OUTPATIENT_CLINIC_OR_DEPARTMENT_OTHER): Payer: Self-pay

## 2024-10-14 MED ORDER — AMPHETAMINE-DEXTROAMPHETAMINE 20 MG PO TABS
20.0000 mg | ORAL_TABLET | Freq: Two times a day (BID) | ORAL | 0 refills | Status: DC
  Filled 2024-10-16: qty 60, 30d supply, fill #0

## 2024-10-16 ENCOUNTER — Other Ambulatory Visit: Payer: Self-pay

## 2024-10-16 ENCOUNTER — Other Ambulatory Visit (HOSPITAL_BASED_OUTPATIENT_CLINIC_OR_DEPARTMENT_OTHER): Payer: Self-pay

## 2024-10-19 DIAGNOSIS — F3341 Major depressive disorder, recurrent, in partial remission: Secondary | ICD-10-CM | POA: Diagnosis not present

## 2024-10-19 DIAGNOSIS — F900 Attention-deficit hyperactivity disorder, predominantly inattentive type: Secondary | ICD-10-CM | POA: Diagnosis not present

## 2024-10-19 DIAGNOSIS — F4310 Post-traumatic stress disorder, unspecified: Secondary | ICD-10-CM | POA: Diagnosis not present

## 2024-10-20 ENCOUNTER — Other Ambulatory Visit: Payer: Self-pay

## 2024-10-20 ENCOUNTER — Other Ambulatory Visit (HOSPITAL_COMMUNITY): Payer: Self-pay

## 2024-10-20 MED ORDER — TRINTELLIX 20 MG PO TABS
20.0000 mg | ORAL_TABLET | Freq: Every day | ORAL | 11 refills | Status: AC
  Filled 2024-10-20: qty 30, 30d supply, fill #0
  Filled 2024-12-11 – 2024-12-13 (×2): qty 30, 30d supply, fill #1

## 2024-10-24 DIAGNOSIS — F332 Major depressive disorder, recurrent severe without psychotic features: Secondary | ICD-10-CM | POA: Diagnosis not present

## 2024-11-13 ENCOUNTER — Other Ambulatory Visit (HOSPITAL_BASED_OUTPATIENT_CLINIC_OR_DEPARTMENT_OTHER): Payer: Self-pay

## 2024-11-13 MED ORDER — AMPHETAMINE-DEXTROAMPHETAMINE 20 MG PO TABS
20.0000 mg | ORAL_TABLET | Freq: Two times a day (BID) | ORAL | 0 refills | Status: DC
  Filled 2024-11-15: qty 60, 30d supply, fill #0

## 2024-11-15 ENCOUNTER — Other Ambulatory Visit (HOSPITAL_BASED_OUTPATIENT_CLINIC_OR_DEPARTMENT_OTHER): Payer: Self-pay

## 2024-11-23 ENCOUNTER — Other Ambulatory Visit: Payer: Self-pay

## 2024-12-11 ENCOUNTER — Other Ambulatory Visit: Payer: Self-pay

## 2024-12-11 ENCOUNTER — Other Ambulatory Visit (HOSPITAL_COMMUNITY): Payer: Self-pay

## 2024-12-11 ENCOUNTER — Other Ambulatory Visit (HOSPITAL_BASED_OUTPATIENT_CLINIC_OR_DEPARTMENT_OTHER): Payer: Self-pay

## 2024-12-11 MED ORDER — AMPHETAMINE-DEXTROAMPHETAMINE 20 MG PO TABS
20.0000 mg | ORAL_TABLET | Freq: Two times a day (BID) | ORAL | 0 refills | Status: AC
  Filled 2024-12-16: qty 60, 30d supply, fill #0

## 2024-12-12 ENCOUNTER — Other Ambulatory Visit: Payer: Self-pay

## 2024-12-13 ENCOUNTER — Other Ambulatory Visit: Payer: Self-pay

## 2024-12-16 ENCOUNTER — Other Ambulatory Visit (HOSPITAL_BASED_OUTPATIENT_CLINIC_OR_DEPARTMENT_OTHER): Payer: Self-pay
